# Patient Record
Sex: Female | Born: 1935 | Race: White | Hispanic: No | Smoking: Former smoker
Health system: Southern US, Community
[De-identification: ages and names within clinical notes are randomized; demographics above are authoritative.]

## PROBLEM LIST (undated history)

## (undated) DIAGNOSIS — I63511 Cerebral infarction due to unspecified occlusion or stenosis of right middle cerebral artery: Secondary | ICD-10-CM

## (undated) DIAGNOSIS — I10 Essential (primary) hypertension: Secondary | ICD-10-CM

## (undated) DIAGNOSIS — E039 Hypothyroidism, unspecified: Secondary | ICD-10-CM

## (undated) DIAGNOSIS — N183 Chronic kidney disease, stage 3 (moderate): Secondary | ICD-10-CM

## (undated) DIAGNOSIS — I2089 Other forms of angina pectoris: Secondary | ICD-10-CM

## (undated) DIAGNOSIS — R1310 Dysphagia, unspecified: Secondary | ICD-10-CM

## (undated) DIAGNOSIS — S42302A Unspecified fracture of shaft of humerus, left arm, initial encounter for closed fracture: Secondary | ICD-10-CM

## (undated) DIAGNOSIS — N390 Urinary tract infection, site not specified: Secondary | ICD-10-CM

## (undated) DIAGNOSIS — I208 Other forms of angina pectoris: Secondary | ICD-10-CM

## (undated) DIAGNOSIS — I2510 Atherosclerotic heart disease of native coronary artery without angina pectoris: Secondary | ICD-10-CM

## (undated) DIAGNOSIS — N179 Acute kidney failure, unspecified: Secondary | ICD-10-CM

## (undated) DIAGNOSIS — G92 Toxic encephalopathy: Secondary | ICD-10-CM

## (undated) DIAGNOSIS — I719 Aortic aneurysm of unspecified site, without rupture: Secondary | ICD-10-CM

## (undated) HISTORY — PX: ABDOMINAL HYSTERECTOMY: SHX81

## (undated) HISTORY — DX: Urinary tract infection, site not specified: N39.0

## (undated) HISTORY — PX: CAROTID ARTERY ANGIOPLASTY: SHX1300

## (undated) HISTORY — PX: CATARACT EXTRACTION: SUR2

## (undated) HISTORY — DX: Unspecified fracture of shaft of humerus, left arm, initial encounter for closed fracture: S42.302A

## (undated) HISTORY — DX: Chronic kidney disease, stage 3 (moderate): N18.3

## (undated) HISTORY — DX: Acute kidney failure, unspecified: N17.9

## (undated) HISTORY — DX: Hypothyroidism, unspecified: E03.9

## (undated) HISTORY — DX: Cerebral infarction due to unspecified occlusion or stenosis of right middle cerebral artery: I63.511

## (undated) HISTORY — DX: Dysphagia, unspecified: R13.10

## (undated) HISTORY — DX: Toxic encephalopathy: G92

## (undated) HISTORY — DX: Essential (primary) hypertension: I10

## (undated) HISTORY — PX: BLADDER SURGERY: SHX569

---

## 2002-08-14 ENCOUNTER — Encounter: Payer: Self-pay | Admitting: Cardiology

## 2002-08-14 ENCOUNTER — Encounter: Admission: RE | Admit: 2002-08-14 | Discharge: 2002-08-14 | Payer: Self-pay | Admitting: Cardiology

## 2002-08-28 ENCOUNTER — Encounter: Payer: Self-pay | Admitting: Cardiology

## 2002-08-28 ENCOUNTER — Encounter: Admission: RE | Admit: 2002-08-28 | Discharge: 2002-08-28 | Payer: Self-pay | Admitting: Cardiology

## 2012-02-14 ENCOUNTER — Other Ambulatory Visit: Payer: Self-pay | Admitting: Cardiology

## 2012-03-27 ENCOUNTER — Other Ambulatory Visit: Payer: Self-pay | Admitting: Cardiology

## 2012-05-11 ENCOUNTER — Other Ambulatory Visit: Payer: Self-pay | Admitting: Cardiology

## 2012-07-20 ENCOUNTER — Observation Stay (HOSPITAL_COMMUNITY)
Admission: EM | Admit: 2012-07-20 | Discharge: 2012-07-22 | Disposition: A | Discharge status: Home / Self Care | Payer: Medicare Other | Attending: Internal Medicine | Admitting: Internal Medicine

## 2012-07-20 ENCOUNTER — Emergency Department (HOSPITAL_COMMUNITY): Payer: Medicare Other

## 2012-07-20 ENCOUNTER — Encounter (HOSPITAL_COMMUNITY): Payer: Self-pay

## 2012-07-20 DIAGNOSIS — E039 Hypothyroidism, unspecified: Secondary | ICD-10-CM

## 2012-07-20 DIAGNOSIS — I2510 Atherosclerotic heart disease of native coronary artery without angina pectoris: Secondary | ICD-10-CM | POA: Insufficient documentation

## 2012-07-20 DIAGNOSIS — M25519 Pain in unspecified shoulder: Secondary | ICD-10-CM | POA: Insufficient documentation

## 2012-07-20 DIAGNOSIS — S0003XA Contusion of scalp, initial encounter: Secondary | ICD-10-CM | POA: Insufficient documentation

## 2012-07-20 DIAGNOSIS — S42213A Unspecified displaced fracture of surgical neck of unspecified humerus, initial encounter for closed fracture: Principal | ICD-10-CM | POA: Insufficient documentation

## 2012-07-20 DIAGNOSIS — S0083XA Contusion of other part of head, initial encounter: Secondary | ICD-10-CM | POA: Insufficient documentation

## 2012-07-20 DIAGNOSIS — M542 Cervicalgia: Secondary | ICD-10-CM | POA: Insufficient documentation

## 2012-07-20 DIAGNOSIS — S42209A Unspecified fracture of upper end of unspecified humerus, initial encounter for closed fracture: Secondary | ICD-10-CM

## 2012-07-20 DIAGNOSIS — I10 Essential (primary) hypertension: Secondary | ICD-10-CM

## 2012-07-20 DIAGNOSIS — S42302A Unspecified fracture of shaft of humerus, left arm, initial encounter for closed fracture: Secondary | ICD-10-CM | POA: Diagnosis present

## 2012-07-20 DIAGNOSIS — I719 Aortic aneurysm of unspecified site, without rupture: Secondary | ICD-10-CM | POA: Diagnosis present

## 2012-07-20 DIAGNOSIS — W010XXA Fall on same level from slipping, tripping and stumbling without subsequent striking against object, initial encounter: Secondary | ICD-10-CM | POA: Insufficient documentation

## 2012-07-20 HISTORY — DX: Other forms of angina pectoris: I20.8

## 2012-07-20 HISTORY — DX: Hypothyroidism, unspecified: E03.9

## 2012-07-20 HISTORY — DX: Atherosclerotic heart disease of native coronary artery without angina pectoris: I25.10

## 2012-07-20 HISTORY — DX: Essential (primary) hypertension: I10

## 2012-07-20 HISTORY — DX: Aortic aneurysm of unspecified site, without rupture: I71.9

## 2012-07-20 HISTORY — DX: Other forms of angina pectoris: I20.89

## 2012-07-20 MED ORDER — FENTANYL CITRATE 0.05 MG/ML IJ SOLN
INTRAMUSCULAR | Status: AC
  Administered 2012-07-20: 50 ug
  Filled 2012-07-20: qty 2

## 2012-07-20 NOTE — ED Notes (Signed)
Pt reports her balance not that good anymore, she admits to needing to use a cane but doesn't.  She was walking outside to pick up mail and lost her balance and fell.  She is c/o left shoulder pain.  No other areas are painful.

## 2012-07-20 NOTE — ED Notes (Signed)
Pt taken off backboard with four person assist.  C-collar in place.

## 2012-07-20 NOTE — ED Notes (Signed)
AVW:[id number]<BJ> Expected date:<BR> Expected time:<BR> Means of arrival:<BR> Comments:<BR> 78 yo female who fell-EMS

## 2012-07-20 NOTE — ED Notes (Signed)
MD at bedside. 

## 2012-07-20 NOTE — ED Provider Notes (Signed)
History     CSN: [id number]  Arrival date & time 07/20/12  2012   First MD Initiated Contact with Patient 07/20/12 2252      Chief Complaint  Patient presents with  . Fall    (Consider location/radiation/quality/duration/timing/severity/associated sxs/prior treatment) HPI Pt reports she stumbled and fell at her home just prior to arrival, hitting her head, complaining of neck pain and L shoulder pain, moderate to severe, aching and worse with movement. Denies LOC.  Past Medical History  Diagnosis Date  . Hypertension   . Aortic aneurysm   . Angina at rest   . Hypothyroid   . Coronary artery disease     Past Surgical History  Procedure Date  . Bladder surgery   . Carotid artery angioplasty   . Abdominal hysterectomy   . Cataract extraction     History reviewed. No pertinent family history.  History  Substance Use Topics  . Smoking status: Former Games developer  . Smokeless tobacco: Not on file  . Alcohol Use: No    OB History    Grav Para Term Preterm Abortions TAB SAB Ect Mult Living                  Review of Systems All other systems reviewed and are negative except as noted in HPI.   Allergies  Sulfa antibiotics  Home Medications   Current Outpatient Rx  Name Route Sig Dispense Refill  . ASPIRIN EC 81 MG PO TBEC Oral Take 81 mg by mouth daily.    . ATENOLOL 50 MG PO TABS Oral Take 50 mg by mouth daily.    Marland Kitchen OLMESARTAN MEDOXOMIL 40 MG PO TABS Oral Take 40 mg by mouth daily.    Marland Kitchen SIMVASTATIN 40 MG PO TABS Oral Take 40 mg by mouth every evening.      BP 204/78  Pulse 62  Temp 97.7 F (36.5 C) (Oral)  Resp 20  SpO2 93%  Physical Exam  Nursing note and vitals reviewed. Constitutional: She is oriented to person, place, and time. She appears well-developed and well-nourished.  HENT:  Head: Normocephalic.       L periorbital ecchymosis  Eyes: EOM are normal. Pupils are equal, round, and reactive to light.  Neck:       In C-collar  Cardiovascular:  Normal rate, normal heart sounds and intact distal pulses.   Pulmonary/Chest: Effort normal and breath sounds normal.  Abdominal: Bowel sounds are normal. She exhibits no distension. There is no tenderness.  Musculoskeletal:       Tender to L shoulder, unable to ROM, neurovascularly intact  Neurological: She is alert and oriented to person, place, and time. She has normal strength. No cranial nerve deficit or sensory deficit.  Skin: Skin is warm and dry. No rash noted.  Psychiatric: She has a normal mood and affect.    ED Course  Procedures (including critical care time)  Labs Reviewed - No data to display Dg Shoulder Left  07/20/2012  *RADIOLOGY REPORT*  Clinical Data: Left shoulder pain status post fall.  LEFT SHOULDER - 2+ VIEW  Comparison: None.  Findings: Positioning is limited by the patient's injury.  There is a comminuted and angulated fracture of the humeral neck.  There is no gross dislocation.  The subacromial space appears narrowed.  On the Y-view, there is possible irregularity of the posterior cortex of the scapula.  IMPRESSION:  1.  Comminuted fracture of the left humeral neck with angulation. 2.  Cannot exclude a  scapular fracture, not optimally evaluated by this examination.   Original Report Authenticated By: Gerrianne Scale, M.D.      No diagnosis found.    MDM  Pt with L humerus fx as above. Sling ordered. Fentanyl given. Head and c-spine CT ordered as well.    12:30 AM Imaging as above. Pt lives alone, concern for safety with fracture. Discussed with Dr. Conley Rolls who will admit for pain control, PT and further eval. Dr. Conley Rolls asked me to discuss with On-call Ortho as well. Awaiting call back from Dr. Sherlean Foot.     Charles B. Bernette Mayers, MD 07/21/12 1112

## 2012-07-20 NOTE — ED Notes (Signed)
Per EMS, pt from apartment.  Pt fell outside of home at [employer].  No LOC.  No c/o neck/back pain.  Pt on LSB with neck brace.  Left shoulder possible rotation.  No active bleeding.  Vitals 140/82, hr 68, resp 16, sats low on arrival.  Oxygen at 2l per [place] 96-97%.  Hx HTN, Pt lives alone.

## 2012-07-21 ENCOUNTER — Encounter (HOSPITAL_COMMUNITY): Payer: Self-pay | Admitting: Internal Medicine

## 2012-07-21 DIAGNOSIS — E039 Hypothyroidism, unspecified: Secondary | ICD-10-CM | POA: Diagnosis present

## 2012-07-21 DIAGNOSIS — I10 Essential (primary) hypertension: Secondary | ICD-10-CM

## 2012-07-21 DIAGNOSIS — I719 Aortic aneurysm of unspecified site, without rupture: Secondary | ICD-10-CM | POA: Diagnosis present

## 2012-07-21 DIAGNOSIS — S42302A Unspecified fracture of shaft of humerus, left arm, initial encounter for closed fracture: Secondary | ICD-10-CM | POA: Diagnosis present

## 2012-07-21 HISTORY — DX: Essential (primary) hypertension: I10

## 2012-07-21 HISTORY — DX: Unspecified fracture of shaft of humerus, left arm, initial encounter for closed fracture: S42.302A

## 2012-07-21 HISTORY — DX: Hypothyroidism, unspecified: E03.9

## 2012-07-21 LAB — CBC
MCH: 31.2 pg (ref 26.0–34.0)
MCHC: 32.3 g/dL (ref 30.0–36.0)
Platelets: 246 10*3/uL (ref 150–400)
RBC: 3.82 MIL/uL — ABNORMAL LOW (ref 3.87–5.11)

## 2012-07-21 LAB — CBC WITH DIFFERENTIAL/PLATELET
Basophils Relative: 1 % (ref 0–1)
Eosinophils Absolute: 0 10*3/uL (ref 0.0–0.7)
Eosinophils Relative: 0 % (ref 0–5)
Lymphs Abs: 1 10*3/uL (ref 0.7–4.0)
MCH: 31.3 pg (ref 26.0–34.0)
MCHC: 32.6 g/dL (ref 30.0–36.0)
MCV: 96 fL (ref 78.0–100.0)
Neutrophils Relative %: 84 % — ABNORMAL HIGH (ref 43–77)
Platelets: 264 10*3/uL (ref 150–400)
RBC: 3.74 MIL/uL — ABNORMAL LOW (ref 3.87–5.11)

## 2012-07-21 LAB — BASIC METABOLIC PANEL
BUN: 29 mg/dL — ABNORMAL HIGH (ref 6–23)
Calcium: 8.7 mg/dL (ref 8.4–10.5)
GFR calc Af Amer: 66 mL/min — ABNORMAL LOW (ref >90)
GFR calc non Af Amer: 57 mL/min — ABNORMAL LOW (ref >90)
Glucose, Bld: 142 mg/dL — ABNORMAL HIGH (ref 70–99)
Potassium: 3.6 mEq/L (ref 3.5–5.1)
Sodium: 138 mEq/L (ref 135–145)

## 2012-07-21 LAB — CREATININE, SERUM
Creatinine, Ser: 0.83 mg/dL (ref 0.50–1.10)
GFR calc non Af Amer: 59 mL/min — ABNORMAL LOW (ref >90)

## 2012-07-21 MED ORDER — SIMVASTATIN 40 MG PO TABS
40.0000 mg | ORAL_TABLET | Freq: Every day | ORAL | Status: DC
  Administered 2012-07-21 (×2): 40 mg via ORAL
  Filled 2012-07-21 (×3): qty 1

## 2012-07-21 MED ORDER — SIMVASTATIN 40 MG PO TABS
40.0000 mg | ORAL_TABLET | Freq: Every evening | ORAL | Status: DC
  Filled 2012-07-21: qty 1

## 2012-07-21 MED ORDER — ENOXAPARIN SODIUM 40 MG/0.4ML ~~LOC~~ SOLN
40.0000 mg | SUBCUTANEOUS | Status: DC
  Administered 2012-07-21: 40 mg via SUBCUTANEOUS
  Filled 2012-07-21 (×2): qty 0.4

## 2012-07-21 MED ORDER — ONDANSETRON HCL 4 MG/2ML IJ SOLN: 4.0000 mg | Freq: Four times a day (QID) | INTRAMUSCULAR | Status: DC | PRN

## 2012-07-21 MED ORDER — SODIUM CHLORIDE 0.9 % IJ SOLN: 3.0000 mL | INTRAMUSCULAR | Status: DC | PRN

## 2012-07-21 MED ORDER — ATENOLOL 50 MG PO TABS
50.0000 mg | ORAL_TABLET | Freq: Every day | ORAL | Status: DC
  Administered 2012-07-21 (×2): 50 mg via ORAL
  Filled 2012-07-21 (×3): qty 1

## 2012-07-21 MED ORDER — SODIUM CHLORIDE 0.9 % IV SOLN
250.0000 mL | INTRAVENOUS | Status: DC | PRN
  Administered 2012-07-21: 250 mL via INTRAVENOUS

## 2012-07-21 MED ORDER — BLISTEX EX OINT
TOPICAL_OINTMENT | CUTANEOUS | Status: AC
  Administered 2012-07-21: 05:00:00
  Filled 2012-07-21: qty 10

## 2012-07-21 MED ORDER — SODIUM CHLORIDE 0.9 % IJ SOLN
3.0000 mL | Freq: Two times a day (BID) | INTRAMUSCULAR | Status: DC
  Administered 2012-07-21 (×2): 3 mL via INTRAVENOUS

## 2012-07-21 MED ORDER — DOCUSATE SODIUM 100 MG PO CAPS
100.0000 mg | ORAL_CAPSULE | Freq: Two times a day (BID) | ORAL | Status: DC
  Administered 2012-07-21 – 2012-07-22 (×3): 100 mg via ORAL

## 2012-07-21 MED ORDER — ATENOLOL 50 MG PO TABS
50.0000 mg | ORAL_TABLET | Freq: Every day | ORAL | Status: DC
  Filled 2012-07-21: qty 1

## 2012-07-21 MED ORDER — HYDROMORPHONE HCL PF 1 MG/ML IJ SOLN
INTRAMUSCULAR | Status: AC
  Filled 2012-07-21: qty 1

## 2012-07-21 MED ORDER — HYDROMORPHONE HCL PF 1 MG/ML IJ SOLN
0.5000 mg | INTRAMUSCULAR | Status: DC | PRN
  Administered 2012-07-21: 0.5 mg via INTRAVENOUS

## 2012-07-21 MED ORDER — ASPIRIN EC 81 MG PO TBEC
81.0000 mg | DELAYED_RELEASE_TABLET | Freq: Every day | ORAL | Status: DC
  Administered 2012-07-21 – 2012-07-22 (×2): 81 mg via ORAL
  Filled 2012-07-21 (×2): qty 1

## 2012-07-21 MED ORDER — MORPHINE SULFATE 2 MG/ML IJ SOLN
2.0000 mg | INTRAMUSCULAR | Status: DC | PRN
  Administered 2012-07-21: 2 mg via INTRAVENOUS
  Filled 2012-07-21: qty 1

## 2012-07-21 MED ORDER — ONDANSETRON HCL 4 MG PO TABS: 4.0000 mg | ORAL_TABLET | Freq: Four times a day (QID) | ORAL | Status: DC | PRN

## 2012-07-21 MED ORDER — IRBESARTAN 300 MG PO TABS
300.0000 mg | ORAL_TABLET | Freq: Every day | ORAL | Status: DC
  Administered 2012-07-21 – 2012-07-22 (×2): 300 mg via ORAL
  Filled 2012-07-21 (×2): qty 1

## 2012-07-21 MED ORDER — ACETAMINOPHEN 325 MG PO TABS
650.0000 mg | ORAL_TABLET | Freq: Four times a day (QID) | ORAL | Status: DC | PRN
  Administered 2012-07-21 – 2012-07-22 (×3): 650 mg via ORAL
  Filled 2012-07-21 (×3): qty 2

## 2012-07-21 NOTE — Evaluation (Signed)
Physical Therapy Evaluation Patient Details Name: Jennifer Zavala MRN: [id number] DOB: 1916/05/10 Today's Date: 07/21/2012 Time: 1130-1150 PT Time Calculation (min): 20 min  PT Assessment / Plan / Recommendation Clinical Impression  78 yo female admitted after fall at home; sustained L humeral fracture. Pt lives alone. On eval, requires Min A for mobility. Discussed d/c options. Pt open to ST rehab prior to returning home alone. Recommend SNF for rehab to maximize independence with functional mobilty .    PT Assessment  Patient needs continued PT services    Follow Up Recommendations  Skilled nursing facility    Barriers to Discharge Decreased caregiver support      Equipment Recommendations  Defer to next venue    Recommendations for Other Services OT consult   Frequency Min 3X/week    Precautions / Restrictions Precautions Precautions: Fall;Shoulder Type of Shoulder Precautions: NWB. L shoulder sling Restrictions Weight Bearing Restrictions: Yes LUE Weight Bearing: Non weight bearing   Pertinent Vitals/Pain       Mobility  Bed Mobility Bed Mobility: Supine to Sit Supine to Sit: HOB elevated;4: Min assist Details for Bed Mobility Assistance: Increased time. VCS safety, technique, hand placement, adhrence to NWB L UE. Utilized bed pad to assist with scooting, positioning Transfers Transfers: Sit to Stand;Stand to Sit Sit to Stand: 4: Min assist;With upper extremity assist;From bed;From elevated surface Stand to Sit: 4: Min assist;With upper extremity assist;With armrests;To chair/3-in-1 Details for Transfer Assistance: VCs safety, technique, hand placement. assist to rise, stabilize, control descent. Reports increased pain in L shoulder with attempt to rise. Ambulation/Gait Ambulation/Gait Assistance: 4: Min assist Ambulation Distance (Feet): 120 Feet Assistive device: Straight cane Ambulation/Gait Assistance Details: Assist to stabilize throughout ambulation.  Limited activity tolerance. Unsteady intermittently Gait Pattern: Step-through pattern;Decreased stride length;Decreased step length - right;Decreased step length - left    Exercises     PT Diagnosis: Difficulty walking;Acute pain  PT Problem List: Decreased activity tolerance;Decreased balance;Decreased mobility;Pain;Decreased knowledge of use of DME PT Treatment Interventions: DME instruction;Gait training;Functional mobility training;Therapeutic activities;Therapeutic exercise;Balance training;Patient/family education   PT Goals Acute Rehab PT Goals PT Goal Formulation: With patient Time For Goal Achievement: 08/04/12 Potential to Achieve Goals: Good Pt will go Supine/Side to Sit: with modified independence PT Goal: Supine/Side to Sit - Progress: Goal set today Pt will go Sit to Supine/Side: with modified independence PT Goal: Sit to Supine/Side - Progress: Goal set today Pt will go Sit to Stand: with modified independence PT Goal: Sit to Stand - Progress: Goal set today Pt will Ambulate: >150 feet;with modified independence PT Goal: Ambulate - Progress: Goal set today  Visit Information  Last PT Received On: 07/21/12 Assistance Needed: +1    Subjective Data  Subjective: "I can't do this" Patient Stated Goal: Home. Less pain   Prior Functioning  Home Living Lives With: Alone Type of Home: Apartment Home Access: Level entry Home Layout: One level Bathroom Toilet: Standard Home Adaptive Equipment: Walker - rolling;Straight cane Prior Function Level of Independence: Independent Communication Communication: No difficulties    Cognition  Overall Cognitive Status: Appears within functional limits for tasks assessed/performed Arousal/Alertness: Awake/alert Orientation Level: Appears intact for tasks assessed Behavior During Session: [facility] for tasks performed    Extremity/Trunk Assessment Right Lower Extremity Assessment RLE ROM/Strength/Tone: [facility] for tasks assessed Left  Lower Extremity Assessment LLE ROM/Strength/Tone: [facility] for tasks assessed   Balance    End of Session PT - End of Session Equipment Utilized During Treatment: Gait belt Activity Tolerance: Patient limited by  pain Patient left: in chair;with call bell/phone within reach  GP Functional Assessment Tool Used: Clinical judgment Functional Limitation: Mobility: Walking and moving around Mobility: Walking and Moving Around Current Status [phone]): At least 20 percent but less than 40 percent impaired, limited or restricted Mobility: Walking and Moving Around Goal Status [phone]): 0 percent impaired, limited or restricted   Rebeca Alert [facility] 07/21/2012, 1:20 PM [phone]

## 2012-07-21 NOTE — Care Management Note (Unsigned)
    Page 1 of 1   07/21/2012     6:32:31 PM   CARE MANAGEMENT NOTE 07/21/2012  Patient:  Jennifer Zavala, Jennifer Zavala   Account Number:  [id number]  Date Initiated:  07/21/2012  Documentation initiated by:  Colleen Can  Subjective/Objective Assessment:   dx comminuted and angulated frature of  humerus     Action/Plan:   Pt wants SNF/lives alone   Anticipated DC Date:  07/23/2012   Anticipated DC Plan:  HOME W HOME HEALTH SERVICES  In-house referral  Clinical Social Worker      DC Planning Services  CM consult      Choice offered to / List presented to:             Status of service:  In process, will continue to follow Medicare Important Message given?   (If response is "NO", the following Medicare IM given date fields will be blank) Date Medicare IM given:   Date Additional Medicare IM given:    Discharge Disposition:    Per UR Regulation:  Reviewed for med. necessity/level of care/duration of stay  If discussed at Long Length of Stay Meetings, dates discussed:    Comments:

## 2012-07-21 NOTE — Progress Notes (Signed)
Clinical Social Work Department BRIEF PSYCHOSOCIAL ASSESSMENT 07/21/2012  Patient:  Jennifer Zavala, Jennifer Zavala     Account Number:  [id number]     Admit date:  07/20/2012  Clinical Social Worker: Lia Foyer, Theresia Majors ,  Date/Time:  07/21/2012 05:31 PM  Referred by:  RN  Date Referred:  07/21/2012 Referred for  SNF Placement   Other Referral:   Interview type:  Patient Other interview type:    PSYCHOSOCIAL DATA Living Status:  ALONE Admitted from facility:   Level of care:   Primary support name:  Ellin Mayhew Primary support relationship to patient:  FAMILY Degree of support available:   Strong and vested    CURRENT CONCERNS Current Concerns  Post-Acute Placement   Other Concerns:    SOCIAL WORK ASSESSMENT / PLAN CSW consulted by RN re: SNF placement. SNF placement was recommended by PT, and patient is currently on observation. RNCM, Bonita Quin requested patient be moved to inpatient status, and was denied. CSW met with patient to discuss d/c plan. The patient currently lives alone at home.  CSW provided education on the SNF process and supportive counseling on not being Medicare eligible for a SNF placement. CSW attempted to plan for d/c with [facility] with the patient but she stated she would not receive much help from her daughter in law Vickie [phone]) due to her having two children. CSW encouraged the patient to speak with Vickie regarding help while she recovers and she stated that she refuses to ask for help from her children. CSW informed the patient of the option to privately pay, and she stated that was not an option. CSW contacted Janie the [facility] Liaison (patient's SNF choice) to inquire about the patient coming. The cost of private pay was too high for the patient. The d/c plan at this time is to d/c home, with [facility] if she stays on observation status. CSW signing off. Please re-consult with SW if needs change.   Assessment/plan status:  Information/Referral to [employer] Other assessment/ plan:   Information/referral to community resources:    PATIENT'S/FAMILY'S RESPONSE TO PLAN OF CARE: Patient thankful for CSW's help, and aware of d/c plan at this time.    Lia Foyer, LCSWA [facility] Clinical Social Worker Contact #: [phone] (PRN)

## 2012-07-21 NOTE — Progress Notes (Signed)
   Seen earlier today by my colleague Dr. Conley Rolls.  Patient seen and examined, data base reviewed.  Came in with a left humeral fracture after a fall.  Mechanical fall, no evidence of loss of consciousness, she also has hypertension.  She probably going to need placement in a SNF.  Clint Lipps Pager: [phone] 07/21/2012, 2:12 PM

## 2012-07-21 NOTE — H&P (Signed)
[facility] History and Physical  BETH GOODLIN WJX:[id number] DOB: [birth year]    PCP:   Johny Blamer, MD   Chief Complaint: Jennifer Zavala with left humeral fracture.  HPI: Jennifer Zavala is an 78 y.o. female with hx of stable CAD, HTN, Aortic aneurysm which she doesn't want repair, lives alone with good functional baseline, tripped and fell suffering a hematoma over her left orbit, and hurt her left shoulder.  ER evaluation included a negative head and cervical CT, and Xray of her left shoulder showed angulated and comminuted humeral fracture, unclear about scapula as it was not well visualized..  Orthopedic Dr Wylene Men was consulted by ER MD, doesn't feel she will need surgery tomorrow, and if she does need ORIF, it will be done electively.  Hospitalist was asked to admit her to set up help since she lives alone.  Rewiew of Systems:  Constitutional: Negative for malaise, fever and chills. No significant weight loss or weight gain Eyes: Negative for eye pain, redness and discharge, diplopia, visual changes, or flashes of light. ENMT: Negative for ear pain, hoarseness, nasal congestion, sinus pressure and sore throat. No headaches; tinnitus, drooling, or problem swallowing. Cardiovascular: Negative for chest pain, palpitations, diaphoresis, dyspnea and peripheral edema. ; No orthopnea, PND Respiratory: Negative for cough, hemoptysis, wheezing and stridor. No pleuritic chestpain. Gastrointestinal: Negative for nausea, vomiting, diarrhea, constipation, abdominal pain, melena, blood in stool, hematemesis, jaundice and rectal bleeding.    Genitourinary: Negative for frequency, dysuria, incontinence,flank pain and hematuria; Musculoskeletal: Negative for back pain and neck pain.  Skin: . Negative for pruritus, rash, abrasions, bruising and skin lesion.; ulcerations Neuro: Negative for headache, lightheadedness and neck stiffness. Negative for weakness, altered level of consciousness ,  altered mental status, extremity weakness, burning feet, involuntary movement, seizure and syncope.  Psych: negative for anxiety, depression, insomnia, tearfulness, panic attacks, hallucinations, paranoia, suicidal or homicidal ideation   Past Medical History  Diagnosis Date  . Hypertension   . Aortic aneurysm   . Angina at rest   . Hypothyroid   . Coronary artery disease     Past Surgical History  Procedure Date  . Bladder surgery   . Carotid artery angioplasty   . Abdominal hysterectomy   . Cataract extraction     Medications:  HOME MEDS: Prior to Admission medications   Medication Sig Start Date End Date Taking? Authorizing Provider  aspirin EC 81 MG tablet Take 81 mg by mouth daily.   Yes Historical Provider, MD  atenolol (TENORMIN) 50 MG tablet Take 50 mg by mouth daily.   Yes Historical Provider, MD  olmesartan (BENICAR) 40 MG tablet Take 40 mg by mouth daily.   Yes Historical Provider, MD  simvastatin (ZOCOR) 40 MG tablet Take 40 mg by mouth every evening.   Yes Historical Provider, MD     Allergies:  Allergies  Allergen Reactions  . Sulfa Antibiotics     unknown    Social History:   reports that she has quit smoking. She does not have any smokeless tobacco history on file. She reports that she does not drink alcohol or use illicit drugs.  Family History: History reviewed. No pertinent family history.   Physical Exam: Filed Vitals:   07/20/12 2032 07/21/12 0049 07/21/12 0051  BP: 204/78 155/58   Pulse: 62 62   Temp: 97.7 F (36.5 C) 98 F (36.7 C)   TempSrc: Oral Oral   Resp: 20 16   SpO2: 93% 89% 94%   Blood pressure 155/58, pulse  62, temperature 98 F (36.7 C), temperature source Oral, resp. rate 16, SpO2 94.00%.  GEN:  Pleasant patient lying in the stretcher in no acute distress; cooperative with exam. PSYCH:  alert and oriented x4; does not appear anxious or depressed; affect is appropriate. HEENT: Mucous membranes pink and anicteric; PERRLA;  EOM intact; no cervical lymphadenopathy nor thyromegaly or carotid bruit; no JVD; There were no stridor. Neck is very supple. Breasts:: Not examined CHEST WALL: No tenderness CHEST: Normal respiration, clear to auscultation bilaterally.  HEART: Regular rate and rhythm.  There is a solf flow murmur at LSB.  BACK: No kyphosis or scoliosis; no CVA tenderness ABDOMEN: soft and non-tender; no masses, no organomegaly, normal abdominal bowel sounds; no pannus; no intertriginous candida. There is no rebound and no distention. Rectal Exam: Not done EXTREMITIES: No bone or joint deformity; age-appropriate arthropathy of the hands and knees; no edema; no ulcerations.  There is no calf tenderness.  Her left shoulder is tender with any minimal movement. Genitalia: not examined PULSES: 2+ and symmetric SKIN: Normal hydration no rash or ulceration CNS: Cranial nerves 2-12 grossly intact no focal lateralizing neurologic deficit.  Speech is fluent; uvula elevated with phonation, facial symmetry and tongue midline. DTR are normal bilaterally, cerebella exam is intact, barbinski is negative and strengths are equaled bilaterally.  No sensory loss.   Labs on Admission:  Basic Metabolic Panel and CBC were pending at time of admission.   Radiological Exams on Admission: Ct Head Wo Contrast  07/21/2012  *RADIOLOGY REPORT*  Clinical Data:  Dizziness with fall.  Left shoulder pain.  CT HEAD WITHOUT CONTRAST CT CERVICAL SPINE WITHOUT CONTRAST  Technique:  Multidetector CT imaging of the head and cervical spine was performed following the standard protocol without intravenous contrast.  Multiplanar CT image reconstructions of the cervical spine were also generated.  Comparison:   None  CT HEAD  Findings: There is no evidence of acute intracranial hemorrhage, mass effect, brain edema or extra-axial fluid collection.  The ventricles and subarachnoid spaces are diffusely prominent consistent with mild atrophy. Diffuse  periventricular low density is most compatible with moderate chronic small vessel ischemic change.   No acute cortical infarction is seen.  There is right maxillary sinus mucosal thickening which appears chronic based on the small size of the right maxillary sinus.  No air-fluid levels are identified.  Intracranial vascular calcifications are noted. The calvarium is intact.  IMPRESSION:  1.  No acute intracranial or calvarial findings. 2.  Atrophy and chronic small vessel ischemic changes. 3.  Chronic-appearing right maxillary sinus mucosal thickening.  CT CERVICAL SPINE  Findings: There is no evidence of acute cervical spine fracture or traumatic subluxation.  There is mild spondylosis with disc space loss most advanced at C5-C6.  There is physiologic rotation at C1- C2.  Scattered facet degenerative changes are present.  No acute soft tissue findings are evident.  The patient appears to be status post left thyroid resection.  IMPRESSION: No evidence of a acute cervical spine fracture, traumatic subluxation or static signs of instability.  Spondylosis as described.   Original Report Authenticated By: Gerrianne Scale, M.D.    Ct Cervical Spine Wo Contrast  07/21/2012  *RADIOLOGY REPORT*  Clinical Data:  Dizziness with fall.  Left shoulder pain.  CT HEAD WITHOUT CONTRAST CT CERVICAL SPINE WITHOUT CONTRAST  Technique:  Multidetector CT imaging of the head and cervical spine was performed following the standard protocol without intravenous contrast.  Multiplanar CT image reconstructions of  the cervical spine were also generated.  Comparison:   None  CT HEAD  Findings: There is no evidence of acute intracranial hemorrhage, mass effect, brain edema or extra-axial fluid collection.  The ventricles and subarachnoid spaces are diffusely prominent consistent with mild atrophy. Diffuse periventricular low density is most compatible with moderate chronic small vessel ischemic change.   No acute cortical infarction is seen.   There is right maxillary sinus mucosal thickening which appears chronic based on the small size of the right maxillary sinus.  No air-fluid levels are identified.  Intracranial vascular calcifications are noted. The calvarium is intact.  IMPRESSION:  1.  No acute intracranial or calvarial findings. 2.  Atrophy and chronic small vessel ischemic changes. 3.  Chronic-appearing right maxillary sinus mucosal thickening.  CT CERVICAL SPINE  Findings: There is no evidence of acute cervical spine fracture or traumatic subluxation.  There is mild spondylosis with disc space loss most advanced at C5-C6.  There is physiologic rotation at C1- C2.  Scattered facet degenerative changes are present.  No acute soft tissue findings are evident.  The patient appears to be status post left thyroid resection.  IMPRESSION: No evidence of a acute cervical spine fracture, traumatic subluxation or static signs of instability.  Spondylosis as described.   Original Report Authenticated By: Gerrianne Scale, M.D.    Dg Shoulder Left  07/20/2012  *RADIOLOGY REPORT*  Clinical Data: Left shoulder pain status post fall.  LEFT SHOULDER - 2+ VIEW  Comparison: None.  Findings: Positioning is limited by the patient's injury.  There is a comminuted and angulated fracture of the humeral neck.  There is no gross dislocation.  The subacromial space appears narrowed.  On the Y-view, there is possible irregularity of the posterior cortex of the scapula.  IMPRESSION:  1.  Comminuted fracture of the left humeral neck with angulation. 2.  Cannot exclude a scapular fracture, not optimally evaluated by this examination.   Original Report Authenticated By: Gerrianne Scale, M.D.     Assessment/Plan Present on Admission:  .Fracture of left humerus .HTN (hypertension) .Hypothyroidism .Aortic aneurysm   PLAN:  Will admit her and consult social service to see if help can be set up at home.  She will be given pain medication and left shoulder  immobilization.  As per ortho, no surgery is planned at this time.  She will have her meds continued.  I will check her TSH for her hx of hypothyroidism.  She is currently not taking any Synthroid.  She is stable and will be admitted to [facility].  She conferred to me that she would like to be DNR/DNI.    Other plans as per orders.  Code Status: DNR/DNI.   Houston Siren, MD. [facility] Pager [phone] 7pm to 7am.  07/21/2012, 1:13 AM

## 2012-07-22 LAB — T3, FREE: T3, Free: 2.7 pg/mL (ref 2.3–4.2)

## 2012-07-22 LAB — BASIC METABOLIC PANEL
BUN: 24 mg/dL — ABNORMAL HIGH (ref 6–23)
CO2: 23 mEq/L (ref 19–32)
Calcium: 9 mg/dL (ref 8.4–10.5)
Creatinine, Ser: 0.85 mg/dL (ref 0.50–1.10)
Glucose, Bld: 103 mg/dL — ABNORMAL HIGH (ref 70–99)

## 2012-07-22 LAB — CBC
MCH: 31.7 pg (ref 26.0–34.0)
MCHC: 32.8 g/dL (ref 30.0–36.0)
MCV: 96.7 fL (ref 78.0–100.0)
Platelets: 191 10*3/uL (ref 150–400)
RDW: 14.3 % (ref 11.5–15.5)

## 2012-07-22 MED ORDER — OXYCODONE-ACETAMINOPHEN 5-325 MG PO TABS: 1.0000 | ORAL_TABLET | ORAL | Status: DC | PRN

## 2012-07-22 NOTE — Progress Notes (Signed)
Per RN and CM information, Pt refuses SNF.   No further CSW needs at this time.  Should any additional needs arise please reconsult CSW.   CSW signing off this admission.   Leron Croak, LCSWA [employer] [phone]

## 2012-07-22 NOTE — Discharge Summary (Signed)
Physician Discharge Summary  Jennifer Zavala ZOX:[id number] DOB: [birth year] DOA: 07/20/2012  PCP: Johny Blamer, MD  Admit date: 07/20/2012 Discharge date: 07/22/2012  Recommendations for Outpatient Follow-up:  1. Followup with primary care physician as well as orthopedic as outpatient  Discharge Diagnoses:  Principal Problem:  *HTN (hypertension) Active Problems:  Fracture of left humerus  Hypothyroidism  Aortic aneurysm   Discharge Condition: Stable  Diet recommendation: Regular  Filed Weights   07/21/12 0300  Weight: 68.6 kg (151 lb 3.8 oz)    History of present illness:  Jennifer Zavala is an 78 y.o. female with hx of stable CAD, HTN, Aortic aneurysm which she doesn't want repair, lives alone with good functional baseline, tripped and fell suffering a hematoma over her left orbit, and hurt her left shoulder. ER evaluation included a negative head and cervical CT, and Xray of her left shoulder showed angulated and comminuted humeral fracture, unclear about scapula as it was not well visualized.. Orthopedic Dr Wylene Men was consulted by ER MD, doesn't feel she will need surgery tomorrow, and if she does need ORIF, it will be done electively. Hospitalist was asked to admit her to set up help since she lives alone.  Hospital Course:   1. Left humeral fracture: Patient came in to the hospital after she fell and broke her left humerus. Upon initial evaluation in the emergency department he physician consulted Dr. Sherlean Foot by the phone and he recommended no surgical intervention and if needed it will be elective. So [facility] hospitalist was asked to admit the patient for pain management. Patient admitted to the hospital as observation. Pain was controlled throughout the hospital stay, patient was seen by PT/OT and they both recommended skilled nursing facility. But patient primary insurance was Medicare, and because of observation status Medicare will not pay for her to go to SNF. Patient is  not eligible for inpatient stay per EHR as I spoke with, I think personally she has very high risk of rehospitalization with further falls and injuries. Patient elected to go home with home health services including PT/OT/RN and aide.  2. Hypothyroidism: Patient is not taking medication for that. This is likely subclinical hypothyroidism, her TSH is 7.0-9 and her free T4 is 1.14. Free T3 is normal to was 2.7.  3. Hypertension: She is on all the scar 10, this is being continued throughout the hospital stay. Patient also on low dose of aspirin is discontinued.  Procedures:  None  Consultations:  Phone consultation with Georgena Spurling of sports medicine and joints replacement of [place]  Discharge Exam: Filed Vitals:   07/21/12 1330 07/21/12 2145 07/22/12 0500 07/22/12 1423  BP: 97/54 160/50 168/80 94/52  Pulse: 54 55 55 62  Temp: 98.2 F (36.8 C) 99.5 F (37.5 C) 97.8 F (36.6 C) 97.6 F (36.4 C)  TempSrc:  Oral Oral Oral  Resp: 16 16 16 16   Height:      Weight:      SpO2: 94% 95% 94% 95%   General: Alert and awake, oriented x3, not in any acute distress. HEENT: anicteric sclera, pupils reactive to light and accommodation, EOMI CVS: S1-S2 clear, no murmur rubs or gallops Chest: clear to auscultation bilaterally, no wheezing, rales or rhonchi Abdomen: soft nontender, nondistended, normal bowel sounds, no organomegaly Extremities: no cyanosis, clubbing or edema noted bilaterally Neuro: Cranial nerves II-XII intact, no focal neurological deficits  Discharge Instructions  Discharge Orders    Future Orders Please Complete By Expires   Increase activity slowly  Medication List  As of 07/22/2012  3:27 PM   TAKE these medications         aspirin EC 81 MG tablet   Take 81 mg by mouth daily.      atenolol 50 MG tablet   Commonly known as: TENORMIN   Take 50 mg by mouth daily.      olmesartan 40 MG tablet   Commonly known as: BENICAR   Take 40 mg by mouth daily.        oxyCODONE-acetaminophen 5-325 MG per tablet   Commonly known as: PERCOCET/ROXICET   Take 1 tablet by mouth every 4 (four) hours as needed for pain.      simvastatin 40 MG tablet   Commonly known as: ZOCOR   Take 40 mg by mouth every evening.           Follow-up Information    Follow up with LUCEY,STEPHEN D, MD in 1 week. (Orthopedic surgeon)    Contact information:   [address] [place] [address] [phone]       Follow up with Johny Blamer, MD in 1 week.   Contact information:   [facility] [address] [place] [address] [phone]           The results of significant diagnostics from this hospitalization (including imaging, microbiology, ancillary and laboratory) are listed below for reference.    Significant Diagnostic Studies: Ct Head Wo Contrast  07/21/2012  *RADIOLOGY REPORT*  Clinical Data:  Dizziness with fall.  Left shoulder pain.  CT HEAD WITHOUT CONTRAST CT CERVICAL SPINE WITHOUT CONTRAST  Technique:  Multidetector CT imaging of the head and cervical spine was performed following the standard protocol without intravenous contrast.  Multiplanar CT image reconstructions of the cervical spine were also generated.  Comparison:   None  CT HEAD  Findings: There is no evidence of acute intracranial hemorrhage, mass effect, brain edema or extra-axial fluid collection.  The ventricles and subarachnoid spaces are diffusely prominent consistent with mild atrophy. Diffuse periventricular low density is most compatible with moderate chronic small vessel ischemic change.   No acute cortical infarction is seen.  There is right maxillary sinus mucosal thickening which appears chronic based on the small size of the right maxillary sinus.  No air-fluid levels are identified.  Intracranial vascular calcifications are noted. The calvarium is intact.  IMPRESSION:  1.  No acute intracranial or calvarial  findings. 2.  Atrophy and chronic small vessel ischemic changes. 3.  Chronic-appearing right maxillary sinus mucosal thickening.  CT CERVICAL SPINE  Findings: There is no evidence of acute cervical spine fracture or traumatic subluxation.  There is mild spondylosis with disc space loss most advanced at C5-C6.  There is physiologic rotation at C1- C2.  Scattered facet degenerative changes are present.  No acute soft tissue findings are evident.  The patient appears to be status post left thyroid resection.  IMPRESSION: No evidence of a acute cervical spine fracture, traumatic subluxation or static signs of instability.  Spondylosis as described.   Original Report Authenticated By: Gerrianne Scale, M.D.    Ct Cervical Spine Wo Contrast  07/21/2012  *RADIOLOGY REPORT*  Clinical Data:  Dizziness with fall.  Left shoulder pain.  CT HEAD WITHOUT CONTRAST CT CERVICAL SPINE WITHOUT CONTRAST  Technique:  Multidetector CT imaging of the head and cervical spine was performed following the standard protocol without intravenous contrast.  Multiplanar CT image reconstructions of the cervical spine  were also generated.  Comparison:   None  CT HEAD  Findings: There is no evidence of acute intracranial hemorrhage, mass effect, brain edema or extra-axial fluid collection.  The ventricles and subarachnoid spaces are diffusely prominent consistent with mild atrophy. Diffuse periventricular low density is most compatible with moderate chronic small vessel ischemic change.   No acute cortical infarction is seen.  There is right maxillary sinus mucosal thickening which appears chronic based on the small size of the right maxillary sinus.  No air-fluid levels are identified.  Intracranial vascular calcifications are noted. The calvarium is intact.  IMPRESSION:  1.  No acute intracranial or calvarial findings. 2.  Atrophy and chronic small vessel ischemic changes. 3.  Chronic-appearing right maxillary sinus mucosal thickening.  CT  CERVICAL SPINE  Findings: There is no evidence of acute cervical spine fracture or traumatic subluxation.  There is mild spondylosis with disc space loss most advanced at C5-C6.  There is physiologic rotation at C1- C2.  Scattered facet degenerative changes are present.  No acute soft tissue findings are evident.  The patient appears to be status post left thyroid resection.  IMPRESSION: No evidence of a acute cervical spine fracture, traumatic subluxation or static signs of instability.  Spondylosis as described.   Original Report Authenticated By: Gerrianne Scale, M.D.    Dg Shoulder Left  07/20/2012  *RADIOLOGY REPORT*  Clinical Data: Left shoulder pain status post fall.  LEFT SHOULDER - 2+ VIEW  Comparison: None.  Findings: Positioning is limited by the patient's injury.  There is a comminuted and angulated fracture of the humeral neck.  There is no gross dislocation.  The subacromial space appears narrowed.  On the Y-view, there is possible irregularity of the posterior cortex of the scapula.  IMPRESSION:  1.  Comminuted fracture of the left humeral neck with angulation. 2.  Cannot exclude a scapular fracture, not optimally evaluated by this examination.   Original Report Authenticated By: Gerrianne Scale, M.D.     Microbiology: No results found for this or any previous visit (from the past 240 hour(s)).   Labs: Basic Metabolic Panel:  Lab 07/22/12 1610 07/21/12 0356 07/21/12 0005  NA 141 -- 138  K 3.6 -- 3.6  CL 105 -- 102  CO2 23 -- 22  GLUCOSE 103* -- 142*  BUN 24* -- 29*  CREATININE 0.85 0.83 0.85  CALCIUM 9.0 -- 8.7  MG -- -- --  PHOS -- -- --   Liver Function Tests: No results found for this basename: AST:5,ALT:5,ALKPHOS:5,BILITOT:5,PROT:5,ALBUMIN:5 in the last 168 hours No results found for this basename: LIPASE:5,AMYLASE:5 in the last 168 hours No results found for this basename: AMMONIA:5 in the last 168 hours CBC:  Lab 07/22/12 0440 07/21/12 0356 07/21/12 0005  WBC  8.0 9.1 8.7  NEUTROABS -- -- 7.3  HGB 12.6 11.9* 11.7*  HCT 38.4 36.8 35.9*  MCV 96.7 96.3 96.0  PLT 191 246 264   Cardiac Enzymes: No results found for this basename: CKTOTAL:5,CKMB:5,CKMBINDEX:5,TROPONINI:5 in the last 168 hours BNP: BNP (last 3 results) No results found for this basename: PROBNP:3 in the last 8760 hours CBG: No results found for this basename: GLUCAP:5 in the last 168 hours  Time coordinating discharge: 40 minutes  Signed:  Sanari Offner A  [facility] 07/22/2012, 3:27 PM

## 2012-07-22 NOTE — Progress Notes (Signed)
Cm spoke with patient with daughter-n-aw at bedside concerning dc planning. Pt refuses SNF. Pt refuses out-of-pocket expense due to observation status. Pt to dc home with [facility] services. Per pt choice [facility] to provide [facility] services. Genevieve Norlander on-site rep Lorrie notified of referral. Rep given facesheet, H/P, Md orders. Start of care scheduled 07/24/12. Pt has acess to DME. Daughter-n-law to stay with patient for first 24 hour period post discharge. Per daughter-n-law assist pt with tx & home care. No other needs requested.   Leonie Green [phone]

## 2012-07-22 NOTE — Progress Notes (Addendum)
Occupational Therapy Treatment Patient Details Name: Jennifer Zavala MRN: [id number] DOB: [birth year] Today's Date: 07/22/2012 Time: 1510-1540 OT Time Calculation (min): 30 min  OT Assessment / Plan / Recommendation Comments on Treatment Session Pt did better with managing her L immobilizer but still needing assist overall with ADL. Pt's daughter present for end of session and states she will be with pt "some" but not 24/7 at home. Pt ready to take a nap once OT assisted back to bed. Verbally reviewed how to don/doff sling and shirt with daughter as well as with nursing tech  so she can assist with donning shirt/sling later if pt going home. Pt's family aware of PT/OT recommendation for SNF.     Follow Up Recommendations  Skilled nursing facility;Other (comment) (If pt refuses, will need HHOT and aide. )    Barriers to Discharge       Equipment Recommendations  None recommended by PT;None recommended by OT    Recommendations for Other Services    Frequency Min 2X/week   Plan Discharge plan remains appropriate    Precautions / Restrictions Precautions Precautions: Fall;Shoulder Type of Shoulder Precautions: NWB L. shoulder sling Precaution Booklet Issued: Yes (comment) Precaution Comments: issued shoulder care handout and reviewed with daughter in law. Explained that pt didnt have surgery on L shoulder but same guidelines follow as on handout for her L injured UE. Required Braces or Orthoses: Other Brace/Splint Other Brace/Splint: sling L UE Restrictions Weight Bearing Restrictions: Yes LUE Weight Bearing: Non weight bearing        ADL  Upper Body Dressing: Simulated;Moderate assistance Where Assessed - Upper Body Dressing: Unsupported sitting ADL Comments: Worked on pt donning/doffing sling herself. She did better this pm and required moderate assist. She was better able to reach for straps and velcro pieces to fasten and unfasten. Pt's daughter came in at end of session and  states she will not be with pt at all times. Emphasized PT/OT recommendation of SNF at discharge. Issued shoudler care handout to pt/daughter and reviewed with daughter. Pt already falling asleep in bed. Pt states she prefers to sleep in a recliner which is a good idea for her as she needs more assist to get up from bed. Pt is still unsafe to discharge home without 24 assist.    OT Diagnosis:    OT Problem List:   OT Treatment Interventions:     OT Goals ADL Goals ADL Goal: Upper Body Dressing - Progress: Progressing toward goals  Visit Information  Last OT Received On: 07/22/12 Assistance Needed: +1    Subjective Data  Subjective: I just want to lie down for awhile Patient Stated Goal: to lie down   Prior Functioning       Cognition  Overall Cognitive Status: Impaired Area of Impairment: Safety/judgement;Problem solving Arousal/Alertness: Awake/alert Orientation Level: Appears intact for tasks assessed Behavior During Session: [facility] for tasks performed Safety/Judgement: Decreased awareness of safety precautions;Decreased safety judgement for tasks assessed;Decreased awareness of need for assistance Cognition - Other Comments: pt up in room on her own when OT entered room. Decreased awareness of need for assist.     Mobility  Shoulder Instructions Bed Mobility Bed Mobility: Sit to Supine Sit to Supine: 4: Min assist Details for Bed Mobility Assistance: increased time, verbal cues for technique. assist to guide down to supine.  Transfers Transfers: Stand to Sit Stand to Sit: 4: Min guard;With upper extremity assist;To bed Details for Transfer Assistance: verbal cues for technique and to step toward [facility]  before sitting.        Exercises      Balance     End of Session OT - End of Session Activity Tolerance: Patient limited by pain Patient left: in bed;with call bell/phone within reach;with family/visitor present  GO     Lennox Laity [phone] 07/22/2012,  4:07 PM

## 2012-07-22 NOTE — Progress Notes (Signed)
Physical Therapy Treatment Patient Details Name: Jennifer Zavala MRN: [id number] DOB: 1916/01/17 Today's Date: 07/22/2012 Time: 4098-1191 PT Time Calculation (min): 16 min  PT Assessment / Plan / Recommendation Comments on Treatment Session  Pt demonstrates poor insight, safety awareness. Resistant to therapists suggestions for safe practices/activity if she goes home. Do not feel pt will be safe to d/c home alone, even with home health aide assistance. Pt will require 24 hour supervision/assist for safety .     Follow Up Recommendations  Skilled nursing facility;Supervision/Assistance - 24 hour    Barriers to Discharge        Equipment Recommendations  None recommended by PT    Recommendations for Other Services OT consult  Frequency Min 3X/week   Plan Discharge plan remains appropriate    Precautions / Restrictions Precautions Precautions: Fall;Shoulder Type of Shoulder Precautions: NWB L. shoulder sling Required Braces or Orthoses: Other Brace/Splint Other Brace/Splint: sling L UE Restrictions Weight Bearing Restrictions: Yes LUE Weight Bearing: Non weight bearing   Pertinent Vitals/Pain L shoulder    Mobility  Bed Mobility Bed Mobility: Not assessed Transfers Transfers: Sit to Stand;Stand to Sit Sit to Stand: 4: Min guard;With armrests;From chair/3-in-1 Stand to Sit: 4: Min guard;To chair/3-in-1;With armrests Details for Transfer Assistance: VCs safety, technique, hand placement.  Ambulation/Gait Ambulation/Gait Assistance: 4: Min assist;4: Min Government social research officer (Feet): 135 Feet Assistive device: Straight cane Ambulation/Gait Assistance Details: Attempted to have pt mobilize at independently as possible. While having pt ambulate to door and open it, pt nearly hit herself in head with door. Difficulty dual tasking (ambulating and maintaining conversation). Intermittent instability noted.  Gait Pattern: Step-through pattern;Decreased stride length      Exercises     PT Diagnosis:    PT Problem List:   PT Treatment Interventions:     PT Goals Acute Rehab PT Goals Pt will go Supine/Side to Sit: with modified independence PT Goal: Supine/Side to Sit - Progress: Progressing toward goal Pt will go Sit to Stand: with modified independence PT Goal: Sit to Stand - Progress: Progressing toward goal Pt will Ambulate: >150 feet;with modified independence;with least restrictive assistive device PT Goal: Ambulate - Progress: Progressing toward goal  Visit Information  Last PT Received On: 07/22/12 Assistance Needed: +1    Subjective Data  Subjective: "I can do it by myself" Patient Stated Goal: Home. Less pain.    Cognition  Overall Cognitive Status: Impaired Area of Impairment: Safety/judgement;Awareness of errors;Awareness of deficits;Problem solving Arousal/Alertness: Awake/alert Orientation Level: Appears intact for tasks assessed Behavior During Session: [facility] for tasks performed Safety/Judgement: Decreased awareness of safety precautions;Decreased safety judgement for tasks assessed;Decreased awareness of need for assistance Cognition - Other Comments: Pt has poor insight into extent of injury and difficulty she will face performing ADLs/mobility if home alone. Resistant to therapist's cues/suggestions/recommendations for safety/UE limitations.     Balance     End of Session PT - End of Session Equipment Utilized During Treatment: Gait belt Activity Tolerance: Patient limited by pain Patient left: in chair;with call bell/phone within reach   GP     Rebeca Alert [facility] 07/22/2012, 11:27 AM [phone]

## 2012-07-22 NOTE — Progress Notes (Signed)
Pt stable, scripts, and d/c instructions given with no questions/concerns voiced by pt or daughter.  Pt transported via wheelchair to private vehicle with NT and daughter.

## 2012-07-22 NOTE — Evaluation (Signed)
Occupational Therapy Evaluation Patient Details Name: Jennifer Zavala MRN: [id number] DOB: [birth year] Today's Date: 07/22/2012 Time: 9629-5284 OT Time Calculation (min): 38 min  OT Assessment / Plan / Recommendation Clinical Impression  78 yo female admitted after fall at home; sustained L humeral fracture. Pt currently limited with ADLs/safety with functional mobility due to only being able to move her R UE due to L shoulder precautions. Will benefit from SNF and is currently not safe to discharge home alone. Pt displays decreased safety awareness and  insight.     OT Assessment  Patient needs continued OT Services    Follow Up Recommendations  Skilled nursing facility    Barriers to Discharge      Equipment Recommendations  None recommended by PT    Recommendations for Other Services    Frequency  Min 2X/week    Precautions / Restrictions Precautions Precautions: Fall;Shoulder Type of Shoulder Precautions: NWB L. shoulder sling Required Braces or Orthoses: Other Brace/Splint Other Brace/Splint: sling L UE Restrictions Weight Bearing Restrictions: Yes LUE Weight Bearing: Non weight bearing        ADL  Eating/Feeding: Simulated;Set up Where Assessed - Eating/Feeding: Chair Grooming: Simulated;Minimal assistance Where Assessed - Grooming: Supported sitting Upper Body Bathing: Performed;Chest;Right arm;Left arm;Abdomen;Moderate assistance Where Assessed - Upper Body Bathing: Supported sitting Lower Body Bathing: Simulated;Moderate assistance Where Assessed - Lower Body Bathing: Supported sit to stand Upper Body Dressing: Simulated;Maximal assistance;Other (comment) (to doff sling, bra, shirt, and don sling, gown) Where Assessed - Upper Body Dressing: Supported sitting Lower Body Dressing: Simulated;Moderate assistance Where Assessed - Lower Body Dressing: Supported sit to stand Toilet Transfer: Simulated;Minimal assistance;Other (comment) (with SPC and verbal cues  for safety) Toileting - Clothing Manipulation and Hygiene: Simulated;Moderate assistance Where Assessed - Toileting Clothing Manipulation and Hygiene: Standing Tub/Shower Transfer Method: Not assessed Equipment Used: Cane ADL Comments: Up to ambulate with PT/OT. Pt encouraged to open door to hallway for herself for practice but when she opened it she pulled it too hard and almost hit herself in the face. Pt not understanding why she has to wear the immobilizer and asking if she can "wrap her arm in a scarf at home." Explained the purpose of the immobilizer. Pt seemed to understand better after thorough explanation as to why she has to wear the immobilizer/its purpose.    OT Diagnosis: Generalized weakness  OT Problem List: Decreased strength;Decreased range of motion;Pain;Decreased knowledge of use of DME or AE;Decreased knowledge of precautions OT Treatment Interventions: Self-care/ADL training;Therapeutic activities;DME and/or AE instruction;Patient/family education   OT Goals Acute Rehab OT Goals OT Goal Formulation: With patient Time For Goal Achievement: 08/05/12 Potential to Achieve Goals: Good ADL Goals Pt Will Perform Grooming: with supervision;Standing at sink ADL Goal: Grooming - Progress: Goal set today Pt Will Perform Upper Body Bathing: with min assist;Sitting, edge of bed;Sitting, chair ADL Goal: Upper Body Bathing - Progress: Goal set today Pt Will Perform Lower Body Bathing: with supervision;Sit to stand from chair;Sit to stand from bed ADL Goal: Lower Body Bathing - Progress: Goal set today Pt Will Perform Upper Body Dressing: with min assist;Unsupported;Other (comment) (including immobilizer) ADL Goal: Upper Body Dressing - Progress: Goal set today Pt Will Perform Lower Body Dressing: with supervision;Sit to stand from chair;Sit to stand from bed ADL Goal: Lower Body Dressing - Progress: Goal set today Pt Will Transfer to Toilet: with supervision;Ambulation;with  DME;3-in-1 ADL Goal: Toilet Transfer - Progress: Goal set today Pt Will Perform Toileting - Clothing Manipulation: with supervision;Standing ADL Goal:  Toileting - Clothing Manipulation - Progress: Goal set today  Visit Information  Last OT Received On: 07/22/12 Assistance Needed: +1 PT/OT Co-Evaluation/Treatment: Yes    Subjective Data  Subjective: why do I have to wear this thing (referring to sling). I dont like it. Patient Stated Goal: wants to go home   Prior Functioning  Vision/Perception  Home Living Lives With: Alone Type of Home: Apartment Home Access: Level entry Home Layout: One level Bathroom Shower/Tub: Engineer, manufacturing systems: Standard Home Adaptive Equipment: Walker - rolling;Straight cane;Shower chair with back;Bedside commode/3-in-1 Prior Function Level of Independence: Independent Driving: Yes Communication Communication: No difficulties      Cognition  Overall Cognitive Status: Impaired Area of Impairment: Safety/judgement;Awareness of errors;Awareness of deficits;Problem solving Arousal/Alertness: Awake/alert Orientation Level: Appears intact for tasks assessed Behavior During Session: [facility] for tasks performed Safety/Judgement: Decreased awareness of safety precautions;Decreased safety judgement for tasks assessed;Decreased awareness of need for assistance Cognition - Other Comments: Pt has poor insight into extent of injury and difficulty she will face performing ADLs/mobility if home alone. Resistant to therapist's cues/suggestions/recommendations for safety/UE limitations.     Extremity/Trunk Assessment Right Upper Extremity Assessment RUE ROM/Strength/Tone: [facility] for tasks assessed Left Upper Extremity Assessment LUE ROM/Strength/Tone: Unable to fully assess;Due to precautions LUE ROM/Strength/Tone Deficits: able to wiggle digits. no edema noted in hand.    Mobility  Shoulder Instructions  Bed Mobility Bed Mobility: Not  assessed Transfers Sit to Stand: 4: Min guard;With armrests;From chair/3-in-1 Stand to Sit: 4: Min guard;To chair/3-in-1;With armrests Details for Transfer Assistance: VCs safety, technique, hand placement.        Exercise     Balance     End of Session OT - End of Session Activity Tolerance: Patient limited by pain Patient left: in chair;with call bell/phone within reach  GO Functional Assessment Tool Used: clinical judgement Functional Limitation: Self care Self Care Current Status (Z6109): At least 40 percent but less than 60 percent impaired, limited or restricted Self Care Goal Status (U0454): At least 1 percent but less than 20 percent impaired, limited or restricted   Lennox Laity [phone] 07/22/2012, 11:43 AM

## 2012-07-22 NOTE — Progress Notes (Signed)
Pt refused SNP...homecare set up to start for Monday with [facility]..the patient requested to be discharged today and daughter stated that would stay with pt 24 hours to assist with ADLs.

## 2012-07-23 ENCOUNTER — Observation Stay (HOSPITAL_COMMUNITY)
Admission: EM | Admit: 2012-07-23 | Discharge: 2012-07-25 | Disposition: A | Discharge status: Skilled Nursing Facility | Payer: Medicare Other | Attending: Family Medicine | Admitting: Family Medicine

## 2012-07-23 DIAGNOSIS — S42309A Unspecified fracture of shaft of humerus, unspecified arm, initial encounter for closed fracture: Secondary | ICD-10-CM

## 2012-07-23 DIAGNOSIS — M79609 Pain in unspecified limb: Secondary | ICD-10-CM | POA: Insufficient documentation

## 2012-07-23 DIAGNOSIS — S42213A Unspecified displaced fracture of surgical neck of unspecified humerus, initial encounter for closed fracture: Principal | ICD-10-CM | POA: Insufficient documentation

## 2012-07-23 DIAGNOSIS — I2510 Atherosclerotic heart disease of native coronary artery without angina pectoris: Secondary | ICD-10-CM | POA: Insufficient documentation

## 2012-07-23 DIAGNOSIS — X58XXXA Exposure to other specified factors, initial encounter: Secondary | ICD-10-CM | POA: Insufficient documentation

## 2012-07-23 DIAGNOSIS — Z79899 Other long term (current) drug therapy: Secondary | ICD-10-CM | POA: Insufficient documentation

## 2012-07-23 DIAGNOSIS — E039 Hypothyroidism, unspecified: Secondary | ICD-10-CM

## 2012-07-23 DIAGNOSIS — Z7982 Long term (current) use of aspirin: Secondary | ICD-10-CM | POA: Insufficient documentation

## 2012-07-23 DIAGNOSIS — S42302A Unspecified fracture of shaft of humerus, left arm, initial encounter for closed fracture: Secondary | ICD-10-CM | POA: Diagnosis present

## 2012-07-23 DIAGNOSIS — I10 Essential (primary) hypertension: Secondary | ICD-10-CM | POA: Diagnosis present

## 2012-07-23 DIAGNOSIS — D649 Anemia, unspecified: Secondary | ICD-10-CM | POA: Insufficient documentation

## 2012-07-23 DIAGNOSIS — I719 Aortic aneurysm of unspecified site, without rupture: Secondary | ICD-10-CM

## 2012-07-23 LAB — CBC WITH DIFFERENTIAL/PLATELET
Basophils Absolute: 0 10*3/uL (ref 0.0–0.1)
Basophils Relative: 0 % (ref 0–1)
Eosinophils Absolute: 0.1 10*3/uL (ref 0.0–0.7)
MCHC: 33.2 g/dL (ref 30.0–36.0)
Neutro Abs: 6.4 10*3/uL (ref 1.7–7.7)
Neutrophils Relative %: 72 % (ref 43–77)
RDW: 14.4 % (ref 11.5–15.5)

## 2012-07-23 LAB — BASIC METABOLIC PANEL
Chloride: 105 mEq/L (ref 96–112)
Creatinine, Ser: 0.92 mg/dL (ref 0.50–1.10)
GFR calc Af Amer: 60 mL/min — ABNORMAL LOW (ref >90)
GFR calc non Af Amer: 52 mL/min — ABNORMAL LOW (ref >90)
Potassium: 3.5 mEq/L (ref 3.5–5.1)

## 2012-07-23 MED ORDER — NITROGLYCERIN 0.4 MG SL SUBL: 0.4000 mg | SUBLINGUAL_TABLET | SUBLINGUAL | Status: DC | PRN

## 2012-07-23 MED ORDER — IRBESARTAN 300 MG PO TABS
300.0000 mg | ORAL_TABLET | Freq: Every day | ORAL | Status: DC
  Administered 2012-07-24 – 2012-07-25 (×2): 300 mg via ORAL
  Filled 2012-07-23 (×3): qty 1

## 2012-07-23 MED ORDER — ACETAMINOPHEN 325 MG PO TABS
650.0000 mg | ORAL_TABLET | Freq: Four times a day (QID) | ORAL | Status: DC | PRN
  Administered 2012-07-23 – 2012-07-25 (×3): 650 mg via ORAL
  Filled 2012-07-23 (×3): qty 2

## 2012-07-23 MED ORDER — HYDROCODONE-ACETAMINOPHEN 5-325 MG PO TABS: 1.0000 | ORAL_TABLET | ORAL | Status: DC | PRN

## 2012-07-23 MED ORDER — SIMVASTATIN 40 MG PO TABS
40.0000 mg | ORAL_TABLET | Freq: Every evening | ORAL | Status: DC
  Administered 2012-07-23 – 2012-07-24 (×2): 40 mg via ORAL
  Filled 2012-07-23 (×3): qty 1

## 2012-07-23 MED ORDER — ASPIRIN EC 81 MG PO TBEC
81.0000 mg | DELAYED_RELEASE_TABLET | Freq: Every day | ORAL | Status: DC
  Filled 2012-07-23: qty 1

## 2012-07-23 MED ORDER — ATENOLOL 50 MG PO TABS
50.0000 mg | ORAL_TABLET | Freq: Every day | ORAL | Status: DC
  Administered 2012-07-23 – 2012-07-25 (×3): 50 mg via ORAL
  Filled 2012-07-23 (×3): qty 1

## 2012-07-23 MED ORDER — ONDANSETRON HCL 4 MG PO TABS: 4.0000 mg | ORAL_TABLET | Freq: Four times a day (QID) | ORAL | Status: DC | PRN

## 2012-07-23 MED ORDER — ASPIRIN EC 81 MG PO TBEC
81.0000 mg | DELAYED_RELEASE_TABLET | Freq: Every day | ORAL | Status: DC
  Administered 2012-07-24 – 2012-07-25 (×2): 81 mg via ORAL
  Filled 2012-07-23 (×2): qty 1

## 2012-07-23 MED ORDER — ACETAMINOPHEN 650 MG RE SUPP: 650.0000 mg | Freq: Four times a day (QID) | RECTAL | Status: DC | PRN

## 2012-07-23 MED ORDER — ONDANSETRON HCL 4 MG/2ML IJ SOLN: 4.0000 mg | Freq: Four times a day (QID) | INTRAMUSCULAR | Status: DC | PRN

## 2012-07-23 NOTE — ED Notes (Signed)
Patient was recently discharged on 9/7 post fall with a dx. of left humerus Fx. patient states she thought she would be able to do things herself when she got home, but the pain was to severe. Patient states can not do any ADL's by herself like grooming,toileting etc. She is here to get some "help".

## 2012-07-23 NOTE — Progress Notes (Signed)
Paged for Jennifer Zavala  Patient recently was admitted on 07/21/12 for Left humeral fracture after a mechanical fall.  At that time ER evaluation consisted of Ct of head and cervical spine which was reportedly negative.  Reportedly initial impression and plan by orthopaedic surgery suggested no surgical intervention while in house.  We had admitted patient for pain control.  Patient lives at home alone and during hospitalization was evaluated by physical therapy who recommended SNF or 24 hour supervision and assistance at home.     Patient chose to go home despite not having 24 hour supervision or assistance.  Is presenting to the ED with failure to thrive/dehydration with an elevated BUN/ Creatinine ration of > 20/1.  We have been contacted for further evaluation and admission orders.  We will request a med/surg bed. Patient is agreeable to go to SNF at this juncture per my discussion with Dr. Linwood Dibbles.  Jennifer Zavala, [employer]

## 2012-07-23 NOTE — ED Provider Notes (Signed)
History     CSN: [id number] Arrival date & time 07/23/12  1322 First MD Initiated Contact with Patient 07/23/12 1501    Chief Complaint  Patient presents with  . Illegal value: [    Social work consult for placement --patient unable to perform ADL's lives alone   HPI Pt was in the hospital recently after having a fall and sustaining a humerus fracture.  She was discharged home because she felt like she could care for herself at home.  She was released yesterday.  Pt returns today because she does not feel like she can manage at home.  Pt states she cannot groom herself, go to the bathroom etc.  Pt denies new complaints.  No fever, no vomiting.  Pt tried to sleep in her recliner but she was unable to get up.  She lives at home alone.  Past Medical History  Diagnosis Date  . Hypertension   . Aortic aneurysm   . Angina at rest   . Hypothyroid   . Coronary artery disease     Past Surgical History  Procedure Date  . Bladder surgery   . Carotid artery angioplasty   . Abdominal hysterectomy   . Cataract extraction     No family history on file.  History  Substance Use Topics  . Smoking status: Former Games developer  . Smokeless tobacco: Not on file  . Alcohol Use: No    OB History    Grav Para Term Preterm Abortions TAB SAB Ect Mult Living                  Review of Systems  All other systems reviewed and are negative.    Allergies  Sulfa antibiotics  Home Medications   Current Outpatient Rx  Name Route Sig Dispense Refill  . ACETAMINOPHEN 500 MG PO TABS Oral Take 1,000 mg by mouth every 6 (six) hours as needed. For pain    . ASPIRIN EC 81 MG PO TBEC Oral Take 81 mg by mouth daily.    . ATENOLOL 50 MG PO TABS Oral Take 50 mg by mouth daily.    Marland Kitchen VITAMIN D 1000 UNITS PO TABS Oral Take 1,000 Units by mouth daily.    . ADULT MULTIVITAMIN W/MINERALS CH Oral Take 1 tablet by mouth daily.    Marland Kitchen NITROGLYCERIN 0.4 MG SL SUBL Sublingual Place 0.4 mg under the tongue every 5 (five)  minutes as needed. For chest pain    . OLMESARTAN MEDOXOMIL 40 MG PO TABS Oral Take 20 mg by mouth daily.     . OMEGA 3-6-9 FATTY ACIDS PO LIQD Oral Take 30 mLs by mouth daily.    Marland Kitchen SIMVASTATIN 40 MG PO TABS Oral Take 40 mg by mouth every evening.      BP 138/49  Pulse 60  Temp 97.9 F (36.6 C) (Oral)  Resp 16  SpO2 91%  Physical Exam  Nursing note and vitals reviewed. Constitutional: She appears well-developed and well-nourished. No distress.  HENT:  Head: Normocephalic and atraumatic.    Right Ear: External ear normal.  Left Ear: External ear normal.  Eyes: Conjunctivae are normal. Right eye exhibits no discharge. Left eye exhibits no discharge. No scleral icterus.  Neck: Neck supple. No tracheal deviation present.  Cardiovascular: Normal rate, regular rhythm and intact distal pulses.   Pulmonary/Chest: Effort normal and breath sounds normal. No stridor. No respiratory distress. She has no wheezes. She has no rales.  Abdominal: Soft. Bowel sounds are normal. She  exhibits no distension. There is no tenderness. There is no rebound and no guarding.  Musculoskeletal: She exhibits no edema and no tenderness.       Left shoulder: She exhibits decreased range of motion, tenderness, swelling and pain. She exhibits normal pulse.  Neurological: She is alert. She has normal strength. No sensory deficit. Cranial nerve deficit:  no gross defecits noted. She exhibits normal muscle tone. She displays no seizure activity. Coordination normal.  Skin: Skin is warm and dry. No rash noted.  Psychiatric: She has a normal mood and affect.    ED Course  Procedures (including critical care time)  Labs Reviewed  CBC WITH DIFFERENTIAL - Abnormal; Notable for the following:    RBC 3.60 (*)     Hemoglobin 11.3 (*)     HCT 34.0 (*)     All other components within normal limits  BASIC METABOLIC PANEL - Abnormal; Notable for the following:    BUN 30 (*)     GFR calc non Af Amer 52 (*)     GFR calc  Af Amer 60 (*)     All other components within normal limits   No results found.    MDM  Patient was recently in the hospital after having fallen sustaining a humerus fracture she was seen in the hospital by social work and occupational therapy. He felt that her needs would require skilled nursing facility however her insurance would not pay for it because she did not be criteria. Case manager has been down to evaluate the patient. Her options will be to either pay out of pocket for skilled nursing facility versus 24-hour home care. Patient already has intermittent home health and they were the ones that found her and sent her back to the hospital. I will call the hospitalist for admission until further arrangements can be made.      Celene Kras, MD 07/23/12 [phone]

## 2012-07-23 NOTE — ED Notes (Signed)
Clinical Social Work Department BRIEF PSYCHOSOCIAL ASSESSMENT 07/23/2012  Patient:  Jennifer Zavala, Jennifer Zavala     Account Number:  [id number]     Admit date:  07/23/2012  Clinical Social Worker:  Lana Fish  Date/Time:  07/23/2012 05:06 PM  Referred by:  Physician  Date Referred:  07/23/2012 Referred for  SNF Placement   Other Referral:   Interview type:  Patient Other interview type:    PSYCHOSOCIAL DATA Living Status:  ALONE Admitted from facility:   Level of care:  Independent Living Primary support name:   Primary support relationship to patient:   Degree of support available:   Limited support provided by daughter-in-law per previous CM note from yesterday's admission.    CURRENT CONCERNS Current Concerns  Post-Acute Placement   Other Concerns:   Patient was recently admitted to hospital due to a fall and sustaining a humerus fracture. Patient was presented with the recommendation to purpose SNF placement; however pt declined due to desire to pursue alternative care that included HH. Patient returned to ED today due to realizing that she cannot provide care for herself soley at home without assistance.    SOCIAL WORK ASSESSMENT / PLAN CM and ED CSW met with pt to discuss plan of care, consisting of SNF placement (if eligible) or the option of utilizing Private Duty Agencies to provide supervision and continual care. Patient also suggested that her grandson may be able to provide care at home in the event that [facility] will not qualify her for SNF placement. Pt verbalized to CSW and CM that she desires to go to [facility] if possible.   Assessment/plan status:  Psychosocial Support/Ongoing Assessment of Needs Other assessment/ plan:   Inpatient CSW to follow up and initiate SNF process once admitted.   Information/referral to community resources:   CM provided patient with Private Duty Agencies information in the event that patient desires to utilize this service for  additional care.    PATIENT'S/FAMILY'S RESPONSE TO PLAN OF CARE: Patient is receptive to SNF placement at this time.    Janann Colonel., MSW, [facility] Clinical Social Worker [phone]

## 2012-07-23 NOTE — ED Notes (Signed)
Pt complained of feeling dizzy and a feeling of motion when she closed her eyes.  Notified Selena Batten, RN

## 2012-07-23 NOTE — H&P (Signed)
History and Physical  JARISSA SHERIFF WUJ:[id number] DOB: 06/22/1916 DOA: 07/23/2012  Referring physician: Linwood Dibbles, MD PCP: Johny Blamer, MD   Chief Complaint: arm pain  HPI:  78 year old woman presented to ED with left arm pain and difficulty caring for self. Was hospitalized 9/5-9/7 for left arm fracture. PT/OT recommended SNF or 24-hour assistance for safety. Patient did not meet admission criteria at that time (appeal by Dr. Arthor Captain was denied) and patient decided to go home with [facility].  Daughter-in-law dropped patient off last night at home, patient stayed alone. Today was unable to get out of recliner and [facility] RN recommended patient return to hospital.  Evaluation in the ED was unremarkable including: afebrile, vitals stable, CBC and BMP unremarkable. Patient specifically denies any new complaints. Only issue is pain in left arm severely limiting mobility. Patient was seen by CM and CSW. I spoke with CM Sharmon Leyden but was unable to reach CSW by telephone. Case was also discussed with referring MD and Dr. Arthor Captain. RN Selena Batten was present for history taking.  Because of difficulty caring for self, patient was referred for observation. She has required no pain medications in ED (oral or IV).  Chart Review:  As above  Review of Systems:  Negative for fever, visual changes, sore throat, rash, chest pain, SOB, dysuria, bleeding, n/v/abdominal pain, diarrhea.  Past Medical History  Diagnosis Date  . Hypertension   . Aortic aneurysm   . Angina at rest   . Hypothyroid   . Coronary artery disease     Past Surgical History  Procedure Date  . Bladder surgery   . Carotid artery angioplasty   . Abdominal hysterectomy   . Cataract extraction     Social History:  reports that she has quit smoking. She does not have any smokeless tobacco history on file. She reports that she does not drink alcohol or use illicit drugs.  Allergies  Allergen Reactions  . Sulfa Antibiotics     unknown      No family history on file.  Prior to Admission medications   Medication Sig Start Date End Date Taking? Authorizing Provider  acetaminophen (TYLENOL) 500 MG tablet Take 1,000 mg by mouth every 6 (six) hours as needed. For pain   Yes Historical Provider, MD  aspirin EC 81 MG tablet Take 81 mg by mouth daily.   Yes Historical Provider, MD  atenolol (TENORMIN) 50 MG tablet Take 50 mg by mouth daily.   Yes Historical Provider, MD  cholecalciferol (VITAMIN D) 1000 UNITS tablet Take 1,000 Units by mouth daily.   Yes Historical Provider, MD  Multiple Vitamin (MULTIVITAMIN WITH MINERALS) TABS Take 1 tablet by mouth daily.   Yes Historical Provider, MD  nitroGLYCERIN (NITROSTAT) 0.4 MG SL tablet Place 0.4 mg under the tongue every 5 (five) minutes as needed. For chest pain   Yes Historical Provider, MD  olmesartan (BENICAR) 40 MG tablet Take 20 mg by mouth daily.    Yes Historical Provider, MD  Omega 3-6-9 Fatty Acids LIQD Take 30 mLs by mouth daily.   Yes Historical Provider, MD  simvastatin (ZOCOR) 40 MG tablet Take 40 mg by mouth every evening.   Yes Historical Provider, MD   Physical Exam: Filed Vitals:   07/23/12 1329  BP: 138/49  Pulse: 60  Temp: 97.9 F (36.6 C)  TempSrc: Oral  Resp: 16  SpO2: 91%    General:  Vigorous, calm, well-appearing. Appears younger than stated age.  Eyes: PERRL, normal lids, irises, conjunctiva  ENT: normal hearing, lips, tongue  Neck: no LAD, masses or thyromegaly  Cardiovascular: RRR, no m/r/g. No LE edema  Respiratory: CTA bilaterally, no w/r/r. Normal respiratory effort.  Abdomen: soft, ntnd  Skin: no rash or induration. Bruising noted left upper arm. LUE in sling. Left hand: normal perfusion, warm, dry; grossly normal strength, no pain with movement  Musculoskeletal: as above, excellent strength RUE/BLE  Psychiatric: grossly normal mood and affect, speech fluent and appropriate  Neurologic: non-focal  Wt Readings from Last 3  Encounters:  07/21/12 68.6 kg (151 lb 3.8 oz)    Labs on Admission:  Basic Metabolic Panel:  Lab 07/23/12 1191 07/22/12 0440 07/21/12 0356 07/21/12 0005  NA 140 141 -- 138  K 3.5 3.6 -- 3.6  CL 105 105 -- 102  CO2 24 23 -- 22  GLUCOSE 90 103* -- 142*  BUN 30* 24* -- 29*  CREATININE 0.92 0.85 0.83 0.85  CALCIUM 9.1 9.0 -- 8.7  MG -- -- -- --  PHOS -- -- -- --   CBC:  Lab 07/23/12 1530 07/22/12 0440 07/21/12 0356 07/21/12 0005  WBC 8.9 8.0 9.1 8.7  NEUTROABS 6.4 -- -- 7.3  HGB 11.3* 12.6 11.9* 11.7*  HCT 34.0* 38.4 36.8 35.9*  MCV 94.4 96.7 96.3 96.0  PLT 216 191 246 264   Active Problems:  Fracture of left humerus  HTN (hypertension)   Assessment/Plan 1. Left humeral neck fracture--resulting in difficulty caring for self. Currently controlled without IV or po pain medication (at rest). 2. HTN--stable, continue olmesartan, atenolol. 3. Hypothyroidism--subclinical (not on medications). 4. History of CAD--stable. Continue ASA, atenolol.  Difficult situation, as above, case was thoroughly reviewed, discussed with previous attending physician, EDP, on-call CM, daughter-in-law at bedside and HCPOA Makaelah Cranfield by telephone (by patient request). At this point there does not appear to be criteria for inpatient admission, which I communicated to patient, POA and family. I discussed the ramifications of this with regard to SNF. However, the patient was found in her recliner by [facility] unable to stand secondary to pain. Family present feels that there is no one who can assist her at home. I offered observation with repeat meeting with CM 9/9 to discuss various private outpatient options to assist with care. Additionally will have CM/UR review obs/admission to assist in care of patient. POA agreed with plan.  Code Status: DNR Family Communication: as above Disposition Plan: pending CM discussion with patient and POA 9/9.  Time spent: 75 minutes, greater than 50% in counseling and  coordination of care.  Brendia Sacks, MD  [facility] Team 5 Pager [phone]. If 7PM-7AM, please contact night-coverage at [email or web address], password [facility] 07/23/2012, 5:30 PM

## 2012-07-23 NOTE — Care Management Note (Signed)
CM spoke with pt concerning dc planning with CSW present in room. Pt previously discharged from hospital with [facility]. Pt did not qualify for inpt status, pt refused SNF. Pt previously discharged home with Genevieve Norlander to provide [facility] services, daughter to provide first 24 hour care. ON 07/23/12  Cm received call from [place], rep Lawson Fiscal, stating [facility] arrived at patient's residence with pt unable to open door due to being in recliner for long period of time. Fire dept opened door, patient complaining of severe pain, EMS notified to bring pt back to hospital. Pt now states agrees to dc to SNF upon discharge. Pt prefers [facility]. Pt understands 3 day qualifying stay for Medicare to cover cost of SNF, pt states willing to pay grandson to provide in home care if grandson agrees to assure 24 care. Pt provided with private duty agency list in case grandson can not provide in home care. Pt explained private duty care cost is out of pocket. Cm spoke with son HPOA Trenda Corliss at [phone], cell/ horme [phone]. Pt son states to call him if any further problems with dc planning. Prefers pt to dc to SNF.   Leonie Green [phone]

## 2012-07-23 NOTE — ED Notes (Signed)
JYN:[id number]<AO> Expected date:07/23/12<BR> Expected time: 1:20 PM<BR> Means of arrival:Ambulance<BR> Comments:<BR> Safety Eval

## 2012-07-24 ENCOUNTER — Encounter (HOSPITAL_COMMUNITY): Payer: Self-pay | Admitting: Orthopedic Surgery

## 2012-07-24 DIAGNOSIS — E039 Hypothyroidism, unspecified: Secondary | ICD-10-CM

## 2012-07-24 MED ORDER — ACETAMINOPHEN 325 MG PO TABS: 650.0000 mg | ORAL_TABLET | Freq: Four times a day (QID) | ORAL | Status: AC | PRN

## 2012-07-24 NOTE — Progress Notes (Signed)
CSW spoke to pt's son.He has left messages for the billing rep at [facility] to contact him. He has not heard back from SNF as of yet. D/C Summary has been faxed to [facility]. Awaiting return call to see if d/c can take place today. SNF bed will be available tomorrow if needed.  Cori Razor LCSW [phone]

## 2012-07-24 NOTE — Progress Notes (Signed)
CSW assisting with d/c planning. Pt readmitted 07/23/12 needing ST SNF placement. Pt requesting placement at [facility]. SNF bed is available today . Pt/family understand SNF is not covered by insurance and will be paying out of pocket for this placement. Pt/family to speak with SNF to review financial requirements. CSW will continue to follow to assist with SNF placement.  Cori Razor LCSW [phone]

## 2012-07-24 NOTE — Discharge Summary (Signed)
Physician Discharge Summary  TYMARA SAUR BJY:[id number] DOB: 11/29/1915 DOA: 07/23/2012  PCP: Johny Blamer, MD  Admit date: 07/23/2012 Discharge date: 07/24/2012  Recommendations for Outpatient Follow-up:  1. Followup with Dr. Georgena Spurling, Orthopedics on 07/27/2012 at 1:15 PM. 2. Followup with Primary Medical Doctor. 3. Repeat thyroid function tests in 4-6 weeks from hospital discharge.  Discharge Diagnoses:  Active Problems:  Fracture of left humerus  HTN (hypertension)   Discharge Condition: Improved and stable.  Diet recommendation: Heart healthy diet.  Filed Weights   07/24/12 0100  Weight: 68.6 kg (151 lb 3.8 oz)    History of present illness:  78 year old woman presented to ED with left arm pain and difficulty caring for self. Was hospitalized 9/5-9/7 for left arm fracture. PT/OT recommended SNF or 24-hour assistance for safety. Patient did not meet admission criteria at that time (appeal by Dr. Arthor Captain was denied) and patient decided to go home with [facility]. Daughter-in-law dropped patient off last night at home, patient stayed alone. On day of this admission she was unable to get out of recliner and [facility] RN recommended patient return to hospital. Evaluation in the ED was unremarkable including: afebrile, vitals stable, CBC and BMP unremarkable. Patient specifically denied any new complaints. Only issue is pain in left arm severely limiting mobility. Patient was seen by CM and CSW.Because of difficulty caring for self, and because it was unsafe to send the patient home, patient was referred for observation     Hospital Course:  1. Comminuted fracture of the left humeral neck with angulation. resulting in difficulty caring for self. On previous admission a couple of days ago, her case was discussed with orthopedic M.D. who did not suggest any urgent surgery. Currently patient has only used Tylenol as needed for pain and indicates pain only with activities causing movement of the  left shoulder/arm. She has a left shoulder immobilizer sling. Discussed with Dr. Georgena Spurling who has provided an appointment to see him in his office for further evaluation and management. 2. HTN--stable, continue olmesartan, atenolol. 3. Hypothyroidism--subclinical (not on medications). Clinically looks euthyroid. Recommend repeating thyroid function tests in 6 weeks. 4. History of CAD--stable. Continue ASA, atenolol. 5. Anemia, possibly chronic: Outpatient evaluation and followup.  Procedures:  None  Consultations:  None  Discharge Exam:  Complains Pain left shoulder/upper arm with movement. Pain is controlled when she lies still and with Tylenol. Denies any other complaints.   Filed Vitals:   07/23/12 2117 07/24/12 0100 07/24/12 0525 07/24/12 1332  BP: 145/78  138/89 130/79  Pulse: 60  63 57  Temp: 98.1 F (36.7 C)  97.8 F (36.6 C) 98.1 F (36.7 C)  TempSrc: Oral  Oral   Resp: 16  16 16   Height:  5\' 4"  (1.626 m)    Weight:  68.6 kg (151 lb 3.8 oz)    SpO2: 93%  92% 92%    General exam: Moderately built and nourished female patient who looks younger than stated health, lying comfortably supine in bed in no obvious distress.  Respiratory system: Clear to auscultation. No increased work of breathing.  Cardiovascular system: S1 and S2 heard, regular rate and rhythm. No JVD, murmurs, gallops or pedal edema.  Gastrointestinal system: Abdomen is nondistended, soft and nontender. Normal bowel sounds heard.  Central nervous system: Alert and oriented x3. No focal neurological deficits.  Extremities: Left upper extremity in sling. Mild bruising of the medial aspect of the left arm. Peripheral pulses are symmetrically well felt. Patient is able  to move left fingers well with good grip. Color of the left upper extremity looks normal.   Discharge Instructions  Discharge Orders    Future Orders Please Complete By Expires   Diet - low sodium heart healthy      Increase  activity slowly      Discharge instructions      Comments:   Continue to support Left arm in [facility] Immobilizer sling.   Call MD for:  severe uncontrolled pain        Medication List  As of 07/24/2012  3:16 PM   TAKE these medications         acetaminophen 325 MG tablet   Commonly known as: TYLENOL   Take 2 tablets (650 mg total) by mouth every 6 (six) hours as needed for pain.      aspirin EC 81 MG tablet   Take 81 mg by mouth daily.      atenolol 50 MG tablet   Commonly known as: TENORMIN   Take 50 mg by mouth daily.      cholecalciferol 1000 UNITS tablet   Commonly known as: VITAMIN D   Take 1,000 Units by mouth daily.      multivitamin with minerals Tabs   Take 1 tablet by mouth daily.      nitroGLYCERIN 0.4 MG SL tablet   Commonly known as: NITROSTAT   Place 0.4 mg under the tongue every 5 (five) minutes as needed. For chest pain      olmesartan 40 MG tablet   Commonly known as: BENICAR   Take 20 mg by mouth daily.      Omega 3-6-9 Fatty Acids Liqd   Take 30 mLs by mouth daily.      simvastatin 40 MG tablet   Commonly known as: ZOCOR   Take 40 mg by mouth every evening.           Follow-up Information    Follow up with Raymon Mutton, MD on 07/27/2012. (at 1:15 pm.)    Contact information:   [address] [place] [address] [phone]       Schedule an appointment as soon as possible for a visit with Johny Blamer, MD.   Contact information:   [facility] [address] [place] [address] [phone]           The results of significant diagnostics from this hospitalization (including imaging, microbiology, ancillary and laboratory) are listed below for reference.    Significant Diagnostic Studies: Ct Head Wo Contrast  07/21/2012  *RADIOLOGY REPORT*  Clinical Data:  Dizziness with fall.  Left shoulder pain.  CT HEAD WITHOUT CONTRAST CT CERVICAL SPINE WITHOUT CONTRAST   Technique:  Multidetector CT imaging of the head and cervical spine was performed following the standard protocol without intravenous contrast.  Multiplanar CT image reconstructions of the cervical spine were also generated.  Comparison:   None  CT HEAD  Findings: There is no evidence of acute intracranial hemorrhage, mass effect, brain edema or extra-axial fluid collection.  The ventricles and subarachnoid spaces are diffusely prominent consistent with mild atrophy. Diffuse periventricular low density is most compatible with moderate chronic small vessel ischemic change.   No acute cortical infarction is seen.  There is right maxillary sinus mucosal thickening which appears chronic based on the small size of the right maxillary sinus.  No air-fluid levels are identified.  Intracranial vascular calcifications are noted. The calvarium is intact.  IMPRESSION:  1.  No acute intracranial or calvarial findings. 2.  Atrophy and chronic small vessel ischemic changes. 3.  Chronic-appearing right maxillary sinus mucosal thickening.  CT CERVICAL SPINE  Findings: There is no evidence of acute cervical spine fracture or traumatic subluxation.  There is mild spondylosis with disc space loss most advanced at C5-C6.  There is physiologic rotation at C1- C2.  Scattered facet degenerative changes are present.  No acute soft tissue findings are evident.  The patient appears to be status post left thyroid resection.  IMPRESSION: No evidence of a acute cervical spine fracture, traumatic subluxation or static signs of instability.  Spondylosis as described.   Original Report Authenticated By: Gerrianne Scale, M.D.    Ct Cervical Spine Wo Contrast  07/21/2012  *RADIOLOGY REPORT*  Clinical Data:  Dizziness with fall.  Left shoulder pain.  CT HEAD WITHOUT CONTRAST CT CERVICAL SPINE WITHOUT CONTRAST  Technique:  Multidetector CT imaging of the head and cervical spine was performed following the standard protocol without intravenous  contrast.  Multiplanar CT image reconstructions of the cervical spine were also generated.  Comparison:   None  CT HEAD  Findings: There is no evidence of acute intracranial hemorrhage, mass effect, brain edema or extra-axial fluid collection.  The ventricles and subarachnoid spaces are diffusely prominent consistent with mild atrophy. Diffuse periventricular low density is most compatible with moderate chronic small vessel ischemic change.   No acute cortical infarction is seen.  There is right maxillary sinus mucosal thickening which appears chronic based on the small size of the right maxillary sinus.  No air-fluid levels are identified.  Intracranial vascular calcifications are noted. The calvarium is intact.  IMPRESSION:  1.  No acute intracranial or calvarial findings. 2.  Atrophy and chronic small vessel ischemic changes. 3.  Chronic-appearing right maxillary sinus mucosal thickening.  CT CERVICAL SPINE  Findings: There is no evidence of acute cervical spine fracture or traumatic subluxation.  There is mild spondylosis with disc space loss most advanced at C5-C6.  There is physiologic rotation at C1- C2.  Scattered facet degenerative changes are present.  No acute soft tissue findings are evident.  The patient appears to be status post left thyroid resection.  IMPRESSION: No evidence of a acute cervical spine fracture, traumatic subluxation or static signs of instability.  Spondylosis as described.   Original Report Authenticated By: Gerrianne Scale, M.D.    Dg Shoulder Left  07/20/2012  *RADIOLOGY REPORT*  Clinical Data: Left shoulder pain status post fall.  LEFT SHOULDER - 2+ VIEW  Comparison: None.  Findings: Positioning is limited by the patient's injury.  There is a comminuted and angulated fracture of the humeral neck.  There is no gross dislocation.  The subacromial space appears narrowed.  On the Y-view, there is possible irregularity of the posterior cortex of the scapula.  IMPRESSION:  1.   Comminuted fracture of the left humeral neck with angulation. 2.  Cannot exclude a scapular fracture, not optimally evaluated by this examination.   Original Report Authenticated By: Gerrianne Scale, M.D.     Microbiology: No results found for this or any previous visit (from the past 240 hour(s)).   Labs: Basic Metabolic Panel:  Lab 07/23/12 1610 07/22/12 0440 07/21/12 0356 07/21/12 0005  NA 140 141 -- 138  K 3.5 3.6 -- 3.6  CL 105 105 -- 102  CO2 24 23 -- 22  GLUCOSE 90 103* -- 142*  BUN 30* 24* -- 29*  CREATININE 0.92  0.85 0.83 0.85  CALCIUM 9.1 9.0 -- 8.7  MG -- -- -- --  PHOS -- -- -- --   Liver Function Tests: No results found for this basename: AST:5,ALT:5,ALKPHOS:5,BILITOT:5,PROT:5,ALBUMIN:5 in the last 168 hours No results found for this basename: LIPASE:5,AMYLASE:5 in the last 168 hours No results found for this basename: AMMONIA:5 in the last 168 hours CBC:  Lab 07/23/12 1530 07/22/12 0440 07/21/12 0356 07/21/12 0005  WBC 8.9 8.0 9.1 8.7  NEUTROABS 6.4 -- -- 7.3  HGB 11.3* 12.6 11.9* 11.7*  HCT 34.0* 38.4 36.8 35.9*  MCV 94.4 96.7 96.3 96.0  PLT 216 191 246 264   Cardiac Enzymes: No results found for this basename: CKTOTAL:5,CKMB:5,CKMBINDEX:5,TROPONINI:5 in the last 168 hours BNP: BNP (last 3 results) No results found for this basename: PROBNP:3 in the last 8760 hours CBG: No results found for this basename: GLUCAP:5 in the last 168 hours  Other lab tests  1. TSH: 7.029, free T4-1.14 and free T3-2.7  Discussed with patient's son Mr. Shelbee Apgar over the phone, updated care and answered questions.  Time coordinating discharge: Less than 30 minutes  Signed:  Anamari Galeas  [facility] 07/24/2012, 3:16 PM

## 2012-07-25 DIAGNOSIS — I10 Essential (primary) hypertension: Secondary | ICD-10-CM

## 2012-07-25 DIAGNOSIS — S42309A Unspecified fracture of shaft of humerus, unspecified arm, initial encounter for closed fracture: Secondary | ICD-10-CM

## 2012-07-25 NOTE — Progress Notes (Signed)
Message received from [facility]. Coverage for SNF placement has been worked out with pt/son and [facility] is able to admit pt this am. CSW will assist with d/c to SNF this morning.  Cori Razor LCSW [phone]

## 2012-07-25 NOTE — Progress Notes (Signed)
Pt d/c to [facility] this am via [facility]. Pt/family were in agreement with d/c plan.  Cori Razor LCSW [phone]

## 2017-10-13 ENCOUNTER — Encounter (HOSPITAL_COMMUNITY): Payer: Self-pay

## 2017-10-13 ENCOUNTER — Inpatient Hospital Stay (HOSPITAL_COMMUNITY)
Admission: EM | Admit: 2017-10-13 | Discharge: 2017-10-18 | DRG: 064 | Disposition: A | Discharge status: Skilled Nursing Facility | Payer: Medicare Other | Attending: Internal Medicine | Admitting: Internal Medicine

## 2017-10-13 ENCOUNTER — Other Ambulatory Visit: Payer: Self-pay

## 2017-10-13 ENCOUNTER — Emergency Department (HOSPITAL_COMMUNITY): Payer: Medicare Other

## 2017-10-13 DIAGNOSIS — Z881 Allergy status to other antibiotic agents status: Secondary | ICD-10-CM

## 2017-10-13 DIAGNOSIS — N183 Chronic kidney disease, stage 3 unspecified: Secondary | ICD-10-CM | POA: Diagnosis present

## 2017-10-13 DIAGNOSIS — I4891 Unspecified atrial fibrillation: Secondary | ICD-10-CM | POA: Diagnosis present

## 2017-10-13 DIAGNOSIS — Z66 Do not resuscitate: Secondary | ICD-10-CM | POA: Diagnosis present

## 2017-10-13 DIAGNOSIS — E872 Acidosis, unspecified: Secondary | ICD-10-CM | POA: Diagnosis present

## 2017-10-13 DIAGNOSIS — Z87891 Personal history of nicotine dependence: Secondary | ICD-10-CM

## 2017-10-13 DIAGNOSIS — R0902 Hypoxemia: Secondary | ICD-10-CM

## 2017-10-13 DIAGNOSIS — R29713 NIHSS score 13: Secondary | ICD-10-CM | POA: Diagnosis present

## 2017-10-13 DIAGNOSIS — Z90710 Acquired absence of both cervix and uterus: Secondary | ICD-10-CM | POA: Diagnosis not present

## 2017-10-13 DIAGNOSIS — E860 Dehydration: Secondary | ICD-10-CM | POA: Diagnosis present

## 2017-10-13 DIAGNOSIS — N179 Acute kidney failure, unspecified: Secondary | ICD-10-CM

## 2017-10-13 DIAGNOSIS — G92 Toxic encephalopathy: Secondary | ICD-10-CM | POA: Diagnosis present

## 2017-10-13 DIAGNOSIS — I129 Hypertensive chronic kidney disease with stage 1 through stage 4 chronic kidney disease, or unspecified chronic kidney disease: Secondary | ICD-10-CM | POA: Diagnosis present

## 2017-10-13 DIAGNOSIS — Z7189 Other specified counseling: Secondary | ICD-10-CM | POA: Diagnosis not present

## 2017-10-13 DIAGNOSIS — R414 Neurologic neglect syndrome: Secondary | ICD-10-CM | POA: Diagnosis present

## 2017-10-13 DIAGNOSIS — Z515 Encounter for palliative care: Secondary | ICD-10-CM | POA: Diagnosis not present

## 2017-10-13 DIAGNOSIS — I361 Nonrheumatic tricuspid (valve) insufficiency: Secondary | ICD-10-CM | POA: Diagnosis not present

## 2017-10-13 DIAGNOSIS — R471 Dysarthria and anarthria: Secondary | ICD-10-CM | POA: Diagnosis present

## 2017-10-13 DIAGNOSIS — R627 Adult failure to thrive: Secondary | ICD-10-CM | POA: Diagnosis present

## 2017-10-13 DIAGNOSIS — Z7982 Long term (current) use of aspirin: Secondary | ICD-10-CM

## 2017-10-13 DIAGNOSIS — I63411 Cerebral infarction due to embolism of right middle cerebral artery: Secondary | ICD-10-CM | POA: Diagnosis present

## 2017-10-13 DIAGNOSIS — E039 Hypothyroidism, unspecified: Secondary | ICD-10-CM | POA: Diagnosis present

## 2017-10-13 DIAGNOSIS — G928 Other toxic encephalopathy: Secondary | ICD-10-CM | POA: Diagnosis present

## 2017-10-13 DIAGNOSIS — J810 Acute pulmonary edema: Secondary | ICD-10-CM | POA: Diagnosis not present

## 2017-10-13 DIAGNOSIS — G8194 Hemiplegia, unspecified affecting left nondominant side: Secondary | ICD-10-CM | POA: Diagnosis present

## 2017-10-13 DIAGNOSIS — E8770 Fluid overload, unspecified: Secondary | ICD-10-CM | POA: Diagnosis not present

## 2017-10-13 DIAGNOSIS — R1310 Dysphagia, unspecified: Secondary | ICD-10-CM | POA: Diagnosis not present

## 2017-10-13 DIAGNOSIS — N390 Urinary tract infection, site not specified: Secondary | ICD-10-CM

## 2017-10-13 DIAGNOSIS — I63511 Cerebral infarction due to unspecified occlusion or stenosis of right middle cerebral artery: Secondary | ICD-10-CM

## 2017-10-13 DIAGNOSIS — E785 Hyperlipidemia, unspecified: Secondary | ICD-10-CM

## 2017-10-13 DIAGNOSIS — J9601 Acute respiratory failure with hypoxia: Secondary | ICD-10-CM | POA: Diagnosis present

## 2017-10-13 DIAGNOSIS — R1312 Dysphagia, oropharyngeal phase: Secondary | ICD-10-CM | POA: Diagnosis not present

## 2017-10-13 DIAGNOSIS — I10 Essential (primary) hypertension: Secondary | ICD-10-CM | POA: Diagnosis not present

## 2017-10-13 DIAGNOSIS — I719 Aortic aneurysm of unspecified site, without rupture: Secondary | ICD-10-CM | POA: Diagnosis present

## 2017-10-13 DIAGNOSIS — Z79899 Other long term (current) drug therapy: Secondary | ICD-10-CM

## 2017-10-13 DIAGNOSIS — I639 Cerebral infarction, unspecified: Secondary | ICD-10-CM

## 2017-10-13 DIAGNOSIS — I2510 Atherosclerotic heart disease of native coronary artery without angina pectoris: Secondary | ICD-10-CM | POA: Diagnosis present

## 2017-10-13 HISTORY — DX: Chronic kidney disease, stage 3 unspecified: N18.30

## 2017-10-13 HISTORY — DX: Acute kidney failure, unspecified: N17.9

## 2017-10-13 HISTORY — DX: Urinary tract infection, site not specified: N39.0

## 2017-10-13 HISTORY — DX: Cerebral infarction due to unspecified occlusion or stenosis of right middle cerebral artery: I63.511

## 2017-10-13 HISTORY — DX: Other toxic encephalopathy: G92.8

## 2017-10-13 LAB — COMPREHENSIVE METABOLIC PANEL
ALK PHOS: 111 U/L (ref 38–126)
ALT: 72 U/L — ABNORMAL HIGH (ref 14–54)
ANION GAP: 14 (ref 5–15)
AST: 64 U/L — ABNORMAL HIGH (ref 15–41)
Albumin: 4 g/dL (ref 3.5–5.0)
BUN: 58 mg/dL — ABNORMAL HIGH (ref 6–20)
CALCIUM: 9 mg/dL (ref 8.9–10.3)
CHLORIDE: 106 mmol/L (ref 101–111)
CO2: 19 mmol/L — ABNORMAL LOW (ref 22–32)
CREATININE: 1.55 mg/dL — AB (ref 0.44–1.00)
GFR, EST AFRICAN AMERICAN: 31 mL/min — AB (ref >60)
GFR, EST NON AFRICAN AMERICAN: 27 mL/min — AB (ref >60)
Glucose, Bld: 158 mg/dL — ABNORMAL HIGH (ref 65–99)
Potassium: 4.6 mmol/L (ref 3.5–5.1)
SODIUM: 139 mmol/L (ref 135–145)
Total Bilirubin: 1.3 mg/dL — ABNORMAL HIGH (ref 0.3–1.2)
Total Protein: 7.7 g/dL (ref 6.5–8.1)

## 2017-10-13 LAB — CBC WITH DIFFERENTIAL/PLATELET
Basophils Absolute: 0 10*3/uL (ref 0.0–0.1)
Basophils Relative: 0 %
EOS ABS: 0 10*3/uL (ref 0.0–0.7)
EOS PCT: 0 %
HCT: 36.8 % (ref 36.0–46.0)
Hemoglobin: 12 g/dL (ref 12.0–15.0)
LYMPHS PCT: 11 %
Lymphs Abs: 1 10*3/uL (ref 0.7–4.0)
MCH: 31.7 pg (ref 26.0–34.0)
MCHC: 32.6 g/dL (ref 30.0–36.0)
MCV: 97.4 fL (ref 78.0–100.0)
MONOS PCT: 5 %
Monocytes Absolute: 0.4 10*3/uL (ref 0.1–1.0)
Neutro Abs: 7.3 10*3/uL (ref 1.7–7.7)
Neutrophils Relative %: 84 %
PLATELETS: 289 10*3/uL (ref 150–400)
RBC: 3.78 MIL/uL — AB (ref 3.87–5.11)
RDW: 16.5 % — ABNORMAL HIGH (ref 11.5–15.5)
WBC: 8.8 10*3/uL (ref 4.0–10.5)

## 2017-10-13 LAB — URINALYSIS, ROUTINE W REFLEX MICROSCOPIC
Bilirubin Urine: NEGATIVE
GLUCOSE, UA: NEGATIVE mg/dL
Hgb urine dipstick: NEGATIVE
Ketones, ur: 5 mg/dL — AB
Leukocytes, UA: NEGATIVE
Nitrite: NEGATIVE
PH: 5 (ref 5.0–8.0)
Protein, ur: 100 mg/dL — AB
SPECIFIC GRAVITY, URINE: 1.018 (ref 1.005–1.030)

## 2017-10-13 LAB — I-STAT CG4 LACTIC ACID, ED
LACTIC ACID, VENOUS: 4.13 mmol/L — AB (ref 0.5–1.9)
LACTIC ACID, VENOUS: 4.62 mmol/L — AB (ref 0.5–1.9)
Lactic Acid, Venous: 3.79 mmol/L (ref 0.5–1.9)

## 2017-10-13 LAB — TSH: TSH: 2.146 u[IU]/mL (ref 0.350–4.500)

## 2017-10-13 MED ORDER — SODIUM CHLORIDE 0.9 % IV BOLUS (SEPSIS)
1000.0000 mL | Freq: Once | INTRAVENOUS | Status: AC
  Administered 2017-10-13: 1000 mL via INTRAVENOUS

## 2017-10-13 MED ORDER — DEXTROSE 5 % IV SOLN
2.0000 g | Freq: Once | INTRAVENOUS | Status: AC
  Administered 2017-10-13: 2 g via INTRAVENOUS
  Filled 2017-10-13: qty 2

## 2017-10-13 MED ORDER — LORAZEPAM 2 MG/ML IJ SOLN
0.5000 mg | Freq: Once | INTRAMUSCULAR | Status: AC
  Administered 2017-10-13: 0.5 mg via INTRAVENOUS
  Filled 2017-10-13: qty 1

## 2017-10-13 MED ORDER — SODIUM CHLORIDE 0.9 % IV BOLUS (SEPSIS)
250.0000 mL | Freq: Once | INTRAVENOUS | Status: AC
  Administered 2017-10-13: 250 mL via INTRAVENOUS

## 2017-10-13 NOTE — H&P (Signed)
History and Physical    Jennifer Zavala XBM:[id number] DOB: 1916/01/13 DOA: 10/13/2017  PCP: Johny BlamerHarris, William, MD   I have briefly reviewed patients [facility].  Patient coming from: Independent living facility.  Chief Complaint: Acute Changes in mentation, failure to thrive.  HPI: Jennifer MiloMarguerite H Zavala is a 83 year old woman with a past medical history significant for hypertension, hypothyroidism, coronary artery disease and hyperlipidemia; who presented to ED from her independent living facility due to failure to thrive and changes in mentation.  Apparently patient has been refusing to do activities that she normally enjoyed and has been able to performed without much assistance.  She has not bee eating, not drinking, essentially confused and moaning and having difficulty expressing herself at times.  On Sunday she even refused to go to church which is something that she regularly does with another family member.  Patient's symptoms continue essentially progressing and becoming even more alarming and the patient was transferred to the ED for further evaluation.  There has not been any fever, coughing, headaches, complains of chest pain, shortness of breath, nausea, vomiting, hematemesis, melena, hematochezia or hematuria.  At the facility they reported increase in urinary frequency.  ED Course: CT scan of the head positive for acute right MCA, neurology was consulted and a stroke protocol essentially initiated.  Patient also with urinalysis that demonstrated concern for UTI and given elevated lactic acid she received IV fluids and was empirically started on Rocephin after blood cultures and urine culture was taken.  Review of Systems:  All other systems reviewed and apart from HPI, are negative.  Past Medical History:  Diagnosis Date  . Angina at rest [facility](HCC)   . Aortic aneurysm (HCC)   . Coronary artery disease   . Hypertension   . Hypothyroid      Past Surgical History:  Procedure Laterality Date  . ABDOMINAL HYSTERECTOMY    . BLADDER SURGERY    . CAROTID ARTERY ANGIOPLASTY    . CATARACT EXTRACTION      Social History  reports that she has quit smoking. she has never used smokeless tobacco. She reports that she does not drink alcohol or use drugs.  Allergies  Allergen Reactions  . Sulfa Antibiotics     unknown    Family history: unable to be reviewed given the patient's current mentation.  Looking on old charts appears to be positive for hypertension and cholesterol.  (Given patient's age might not be contributory at this point).  Prior to Admission medications   Medication Sig Start Date End Date Taking? Authorizing Provider  aspirin EC 81 MG tablet Take 81 mg by mouth daily.   Yes [provider]  atenolol (TENORMIN) 50 MG tablet Take 50 mg by mouth daily.   Yes [provider]  ENSURE PLUS (ENSURE PLUS) LIQD Take 237 mLs by mouth 3 (three) times daily between meals.   Yes [provider]  levothyroxine (SYNTHROID, LEVOTHROID) 50 MCG tablet Take 50 mcg by mouth daily before breakfast.   Yes [provider]  Multiple Vitamin (MULTIVITAMIN WITH MINERALS) TABS Take 1 tablet by mouth daily.   Yes [provider]  nitroGLYCERIN (NITROSTAT) 0.4 MG SL tablet Place 0.4 mg under the tongue every 5 (five) minutes as needed. For chest pain   Yes [provider]  cholecalciferol (VITAMIN D) 1000 UNITS tablet Take 1,000 Units by mouth daily.    [provider]  levothyroxine (SYNTHROID, LEVOTHROID) 25 MCG tablet Take 25  mcg by mouth daily before breakfast.    [provider]  olmesartan (BENICAR) 40 MG tablet Take 20 mg by mouth daily.     [provider]  Omega 3-6-9 Fatty Acids LIQD Take 30 mLs by mouth daily.    [provider]  pravastatin (PRAVACHOL) 40 MG tablet Take 40 mg by mouth daily.    [provider]  simvastatin (ZOCOR)  40 MG tablet Take 40 mg by mouth every evening.    [provider]    Physical Exam: Vitals:   10/13/17 1345 10/13/17 1430 10/13/17 1600 10/13/17 1630  BP: 98/87 (!) 94/48 (!) 134/99 (!) 136/97  Pulse:  68  (!) 168  Resp: (!) 32 20 (!) 23 (!) 27  Temp:      TempSrc:      SpO2:  (!) 79%  100%   Constitutional: Oriented x1, poor insight, was able to follow simple commands but neglecting left side was appreciated.  No icterus, no nystagmus, EOMI ENMT: Dry mucous membranes, cracked lips and xerostomia appreciated.  No erythema, no thrush, no exudates.  Neck: No bruits, no thyromegaly, no masses.  Left side scar from prior endarterectomy.   Respiratory: clear to auscultation bilaterally, no wheezing, no crackles. Normal respiratory effort. No accessory muscle use.  Cardiovascular: Irregular, no rubs, no gallops, no appreciated murmur.  No JVD seen on exam.   Abdomen: Soft, non-tenderness, no distention, positive bowel sounds.  No hepatosplenomegaly appreciated on exam.    Musculoskeletal: no clubbing / cyanosis. No joint deformity upper and lower extremities. Good ROM, no contractures. Normal muscle tone.  Skin: no rashes, lesions, ulcers. No induration Neurologic: CN 2-12 appear intact. Strength 4-5/5 in right upper and right lower extremities; 3/5 on her left upper and lower extremities.  Patient able to move 4 limbs spontaneously but is either no comprehending or neglecting when asked to participate on certain activities with her left side. Psychiatric: Poor insight, oriented to person and was able to express that [facility]; no hallucinations.    Labs on Admission: I have personally reviewed following labs and imaging studies  CBC: Recent Labs  Lab 10/13/17 1243  WBC 8.8  NEUTROABS 7.3  HGB 12.0  HCT 36.8  MCV 97.4  PLT 289   Basic Metabolic Panel: Recent Labs  Lab 10/13/17 1243  NA 139  K 4.6  CL 106  CO2 19*  GLUCOSE 158*  BUN 58*  CREATININE  1.55*  CALCIUM 9.0   Liver Function Tests: Recent Labs  Lab 10/13/17 1243  AST 64*  ALT 72*  ALKPHOS 111  BILITOT 1.3*  PROT 7.7  ALBUMIN 4.0   Urine analysis:    Component Value Date/Time   COLORURINE AMBER (A) 10/13/2017 1245   APPEARANCEUR HAZY (A) 10/13/2017 1245   LABSPEC 1.018 10/13/2017 1245   PHURINE 5.0 10/13/2017 1245   GLUCOSEU NEGATIVE 10/13/2017 1245   HGBUR NEGATIVE 10/13/2017 1245   BILIRUBINUR NEGATIVE 10/13/2017 1245   KETONESUR 5 (A) 10/13/2017 1245   PROTEINUR 100 (A) 10/13/2017 1245   NITRITE NEGATIVE 10/13/2017 1245   LEUKOCYTESUR NEGATIVE 10/13/2017 1245     Radiological Exams on Admission: Ct Head Wo Contrast  Result Date: 10/13/2017 CLINICAL DATA:  Mental status changes. EXAM: CT HEAD WITHOUT CONTRAST TECHNIQUE: Contiguous axial images were obtained from the base of the skull through the vertex without intravenous contrast. COMPARISON:  07/21/2012 FINDINGS: Brain: Moderate low density in the periventricular white matter likely related to small vessel disease.  Cortically based infarct involving the posterior right MCA distribution, including on image 18/series 2. Sulci effacement. No midline shift. Expected cerebral and cerebellar volume loss. No hydrocephalus, hemorrhage, intra-axial, or extra-axial fluid collection. Vascular: Intracranial atherosclerosis. Skull: Normal Sinuses/Orbits: Normal imaged portions of the orbits and globes. Clear paranasal sinuses and mastoid air cells. Other: None. IMPRESSION: 1. Right posterior middle cerebral artery distribution cortically based infarct, favored to be subacute. Sulci effacement but no significant midline shift. 2.  Cerebral atrophy and small vessel ischemic change. These results were called by telephone at the time of interpretation on 10/13/2017 at 3:53 pm to Dr. Alvira Monday , who verbally acknowledged these results. Electronically Signed   By: Jeronimo Greaves M.D.   On: 10/13/2017 15:55    EKG:  Atrial  fibrillation and right bundle branch block appreciated on EKG, no prior EKG in our system for comparison.  Assessment/Plan 1-acute right MCA stroke [facility]): big stroke seen on the right MCA distribution on CT scan. -Patient last seen normal approximately 3-4 days ago.  Completely out of the therapeutic window for TPA. -Given the patient's stroke size also high risk for hemorrhagic conversion. -Neurology has evaluated the patient and recommended aspirin 300 mg rectally or 325mg  orally if she passed swallowing evaluation. -Following a stroke protocol will check MRI/MRA, 2D echo, carotid Dopplers -Patient will be kept on telemetry and will be permissive with hypertension in the setting of ischemic event -(of note, patient was actively taking baby aspirin on daily basis)  2-atrial fibrillation -New for the patient I am most likely the reason for her acute ischemic stroke. -Will hold on anticoagulation given the size of her stroke and increase chances for hemorrhagic conversion -Will use aspirin -Currently without RVR; will start patient on metoprolol IV 2.5 mg every 8 hours and follow heart rate. -Will check TSH and 2D echo.  3-HTN (hypertension): -Holding oral antihypertensive regimen while n.p.o. Permissive hypertension in the setting of ischemic stroke -Blood pressure stable most likely will stay control with the use of IV metoprolol  4-hypothyroidism -Continue Synthroid (given IV for now). -Follow TSH  5- Acute lower UTI -Patient denies dysuria but have a dirty urine and reports increased frequency. -Urine culture has been ordered -Will continue empiric IV Rocephin.  6-Lactic acidosis -IV fluids given in the ED -Appears to be secondary to dehydration/infection -Will continue gentle fluid resuscitation. -Follow lactic acid level.  7-Toxic metabolic encephalopathy: In the setting of acute stroke and presumed underlying UTI -Continue Rocephin -IV fluids and further workup as  mentioned above for acute/subacute stroke  8-Acute renal failure superimposed on stage 3 chronic kidney disease (HCC) -Will provide IV fluids and hold nephrotoxic agents. -Follow basic metabolic panel to assess renal function trend.  9-Hyperlipidemia -Plan is to continue statins once able to take by mouth.   Time: 70 minutes.   DVT prophylaxis: SCD's Code Status: Partial code (okay with intubation, BiPAP and ACLS medications) Family Communication: No family at bedside. Disposition Plan: To be determined.  Came from independent living facility; but the prognosis is undetermined currently.  Will admit to [facility] to perform stroke workup and further determine intervention for Ms. Hollice Espy. Consults called: Neurology and palliative care Admission status: Inpatient, stepdown, LOS > 2 midnight   Vassie Loll MD [facility] Pager [phone]  If 7PM-7AM, please contact night-coverage [email or web address] Password [email or web address]  10/13/2017, 6:45 PM

## 2017-10-13 NOTE — ED Notes (Signed)
Bed: [id number] Expected date:  Expected time:  Means of arrival: Ambulance Comments: EMS

## 2017-10-13 NOTE — ED Notes (Signed)
Attempted to call report charge nurse unable to take at this time.

## 2017-10-13 NOTE — ED Provider Notes (Signed)
[facility]-EMERGENCY DEPT Provider Note   CSN: [id number] Arrival date & time: 10/13/17  1159     History   Chief Complaint Chief Complaint  Patient presents with  . Altered Mental Status    HPI Jennifer Zavala is a 83 y.o. female.  HPI   Today, not self, moaning and groaning not answering questions, not acting herself Friday seemed a little fatigued Sunday didn't feel like going to church, reported was too dark and cold and that was not normal  At baseline lives independently in senior living facility, is alert, no history of dementia. Daughter in law normally eats with her on Thursdays and went to see her today and found her acting completely differently than baseline No known infectious symptoms, no known falls or chest pain No recent medication changes Unknown when last normal Facility had reported that over last few weeks has been missing meals more.    Past Medical History:  Diagnosis Date  . Angina at rest [facility])   . Aortic aneurysm (HCC)   . Coronary artery disease   . Hypertension   . Hypothyroid     Patient Active Problem List   Diagnosis Date Noted  . Acute right MCA stroke (HCC) 10/13/2017  . Acute lower UTI 10/13/2017  . Lactic acidosis 10/13/2017  . Toxic metabolic encephalopathy 10/13/2017  . Acute renal failure superimposed on stage 3 chronic kidney disease (HCC) 10/13/2017  . Fracture of left humerus 07/21/2012  . HTN (hypertension) 07/21/2012  . Hypothyroidism 07/21/2012  . Aortic aneurysm (HCC) 07/21/2012    Past Surgical History:  Procedure Laterality Date  . ABDOMINAL HYSTERECTOMY    . BLADDER SURGERY    . CAROTID ARTERY ANGIOPLASTY    . CATARACT EXTRACTION      OB History    No data available       Home Medications    Prior to Admission medications   Medication Sig Start Date End Date Taking? Authorizing Provider  aspirin EC 81 MG tablet Take 81 mg by mouth daily.   Yes [provider]    atenolol (TENORMIN) 50 MG tablet Take 50 mg by mouth daily.   Yes [provider]  ENSURE PLUS (ENSURE PLUS) LIQD Take 237 mLs by mouth 3 (three) times daily between meals.   Yes [provider]  levothyroxine (SYNTHROID, LEVOTHROID) 50 MCG tablet Take 50 mcg by mouth daily before breakfast.   Yes [provider]  Multiple Vitamin (MULTIVITAMIN WITH MINERALS) TABS Take 1 tablet by mouth daily.   Yes [provider]  nitroGLYCERIN (NITROSTAT) 0.4 MG SL tablet Place 0.4 mg under the tongue every 5 (five) minutes as needed. For chest pain   Yes [provider]  cholecalciferol (VITAMIN D) 1000 UNITS tablet Take 1,000 Units by mouth daily.    [provider]  levothyroxine (SYNTHROID, LEVOTHROID) 25 MCG tablet Take 25 mcg by mouth daily before breakfast.    [provider]  olmesartan (BENICAR) 40 MG tablet Take 20 mg by mouth daily.     [provider]  Omega 3-6-9 Fatty Acids LIQD Take 30 mLs by mouth daily.    [provider]  pravastatin (PRAVACHOL) 40 MG tablet Take 40 mg by mouth daily.    [provider]  simvastatin (ZOCOR) 40 MG tablet Take 40 mg by mouth every evening.    [provider]    Family History History reviewed. No pertinent family history.  Social History Social History  Tobacco Use  . Smoking status: Former Games developermoker  . Smokeless tobacco: Never Used  Substance Use Topics  . Alcohol use: No  . Drug use: No     Allergies   Sulfa antibiotics   Review of Systems Review of Systems  Unable to perform ROS: Mental status change     Physical Exam Updated Vital Signs BP (!) 136/97   Pulse (!) 168   Temp (!) 96.7 F (35.9 C) (Rectal)   Resp (!) 27   SpO2 100%   Physical Exam  Constitutional: She appears well-developed and well-nourished. She has a sickly appearance. She appears ill. She appears distressed (moaning, not sitting still).  HENT:  Head: Normocephalic  and atraumatic.  Eyes: Conjunctivae and EOM are normal.  Neck: Normal range of motion.  Cardiovascular: Normal heart sounds and intact distal pulses. A regularly irregular rhythm present. Tachycardia present. Exam reveals no gallop and no friction rub.  No murmur heard. Pulmonary/Chest: Effort normal and breath sounds normal. No respiratory distress. She has no wheezes. She has no rales.  Abdominal: Soft. She exhibits no distension. There is no tenderness. There is no guarding.  Musculoskeletal: She exhibits no edema or tenderness.  Neurological: She is alert. GCS eye subscore is 4. GCS verbal subscore is 4. GCS motor subscore is 6.  Patient moaning, following intermittent commands Normal strength noted right side Moving both legs spontaneously, moving both arms spontaneously When lifting arms for pronator drift, unclear if patient with weakness on left or if having difficulty following commands, will hold up her arm then place it on her head Shows neglect of left side Reports feeling right leg, when touching left states "I don't know" . Difficulty following commands on left but shows overall confusion, moaning, exam limited   Skin: Skin is warm and dry. No rash noted. She is not diaphoretic. No erythema.  Nursing note and vitals reviewed.    ED Treatments / Results  Labs (all labs ordered are listed, but only abnormal results are displayed) Labs Reviewed  CBC WITH DIFFERENTIAL/PLATELET - Abnormal; Notable for the following components:      Result Value   RBC 3.78 (*)    RDW 16.5 (*)    All other components within normal limits  COMPREHENSIVE METABOLIC PANEL - Abnormal; Notable for the following components:   CO2 19 (*)    Glucose, Bld 158 (*)    BUN 58 (*)    Creatinine, Ser 1.55 (*)    AST 64 (*)    ALT 72 (*)    Total Bilirubin 1.3 (*)    GFR calc non Af Amer 27 (*)    GFR calc Af Amer 31 (*)    All other components within normal limits  URINALYSIS, ROUTINE W REFLEX  MICROSCOPIC - Abnormal; Notable for the following components:   Color, Urine AMBER (*)    APPearance HAZY (*)    Ketones, ur 5 (*)    Protein, ur 100 (*)    Bacteria, UA MANY (*)    Squamous Epithelial / LPF 0-5 (*)    All other components within normal limits  I-STAT CG4 LACTIC ACID, ED - Abnormal; Notable for the following components:   Lactic Acid, Venous 4.13 (*)    All other components within normal limits  I-STAT CG4 LACTIC ACID, ED - Abnormal; Notable for the following components:   Lactic Acid, Venous 3.79 (*)    All other components within normal limits  I-STAT CG4 LACTIC ACID, ED - Abnormal; Notable  for the following components:   Lactic Acid, Venous 4.62 (*)    All other components within normal limits  URINE CULTURE  CULTURE, BLOOD (ROUTINE X 2)  CULTURE, BLOOD (ROUTINE X 2)  TROPONIN I  TSH  I-STAT CG4 LACTIC ACID, ED    EKG  EKG Interpretation  Date/Time:  Thursday October 13 2017 12:25:06 EST Ventricular Rate:  123 PR Interval:    QRS Duration: 135 QT Interval:  345 QTC Calculation: 504 R Axis:   25 Text Interpretation:  Atrial fibrillation Right bundle branch block Repol abnrm suggests ischemia, diffuse leads No previous ECGs available Confirmed by Alvira Monday ([id number]) on 10/13/2017 2:33:36 PM       Radiology Ct Head Wo Contrast  Result Date: 10/13/2017 CLINICAL DATA:  Mental status changes. EXAM: CT HEAD WITHOUT CONTRAST TECHNIQUE: Contiguous axial images were obtained from the base of the skull through the vertex without intravenous contrast. COMPARISON:  07/21/2012 FINDINGS: Brain: Moderate low density in the periventricular white matter likely related to small vessel disease. Cortically based infarct involving the posterior right MCA distribution, including on image 18/series 2. Sulci effacement. No midline shift. Expected cerebral and cerebellar volume loss. No hydrocephalus, hemorrhage, intra-axial, or extra-axial fluid collection. Vascular:  Intracranial atherosclerosis. Skull: Normal Sinuses/Orbits: Normal imaged portions of the orbits and globes. Clear paranasal sinuses and mastoid air cells. Other: None. IMPRESSION: 1. Right posterior middle cerebral artery distribution cortically based infarct, favored to be subacute. Sulci effacement but no significant midline shift. 2.  Cerebral atrophy and small vessel ischemic change. These results were called by telephone at the time of interpretation on 10/13/2017 at 3:53 pm to Dr. Alvira Monday , who verbally acknowledged these results. Electronically Signed   By: Jeronimo Greaves M.D.   On: 10/13/2017 15:55    Procedures .Critical Care Performed by: Alvira Monday, MD Authorized by: Alvira Monday, MD   Comments:     CRITICAL CARE: CVA, transfer Performed by: Lynnea Ferrier   Total critical care time: 40 minutes  Critical care time was exclusive of separately billable procedures and treating other patients.  Critical care was necessary to treat or prevent imminent or life-threatening deterioration.  Critical care was time spent personally by me on the following activities: development of treatment plan with patient and/or surrogate as well as nursing, discussions with consultants, evaluation of patient's response to treatment, examination of patient, obtaining history from patient or surrogate, ordering and performing treatments and interventions, ordering and review of laboratory studies, ordering and review of radiographic studies, pulse oximetry and re-evaluation of patient's condition.    (including critical care time)  Medications Ordered in ED Medications  cefTRIAXone (ROCEPHIN) 2 g in dextrose 5 % 50 mL IVPB (0 g Intravenous Stopped 10/13/17 1510)  sodium chloride 0.9 % bolus 1,000 mL (0 mLs Intravenous Stopped 10/13/17 1308)    And  sodium chloride 0.9 % bolus 1,000 mL (0 mLs Intravenous Stopped 10/13/17 1339)    And  sodium chloride 0.9 % bolus 250 mL (0 mLs  Intravenous Stopped 10/13/17 1534)  LORazepam (ATIVAN) injection 0.5 mg (0.5 mg Intravenous Given 10/13/17 1441)     Initial Impression / Assessment and Plan / ED Course  I have reviewed the triage vital signs and the nursing notes.  Pertinent labs & imaging results that were available during my care of the patient were reviewed by me and considered in my medical decision making (see chart for details).    83 year old female with a history of coronary artery  disease, hypertension, hypothyroidism presents with concern for altered mental status.  Patient in atrial fibrillation with RVR on arrival to the emergency department with a rate in the 130s, without any history of atrial fibrillation.  Her lactic acid is elevated above 4.  Unclear initially if vital sign abnormalities secondary to severe dehydration, given history of patient not eating or drinking, or possible sepsis.  Given urinalysis shows many bacteria, will cover for possible infection with Rocephin, and patient was given fluids per sepsis protocol.  Heart rate improving with fluids to the low 100s, and given suspect other acute pathology as etiology of RVR with improvement with fluids/possible infection/dehydration as underlying cause, will not pursue further rate control agents at this time.    Given concern for possible left-sided neglect on exam, CT head was done which confirmed large right-sided MCA CVA.  Unclear last known normal.  Discussed with neurology, Dr. Laurence SlateAroor who came to bedside to evaluate the patient.  She will be transferred to [facility] for further care.  Discussed with son, Lorin PicketScott, who is POA and [place] from [place] and arriving tomorrow. Agree that patient is DNR, however he will discuss intubation and other interventions with other family members.  Patient awaiting [facility].   Final Clinical Impressions(s) / ED Diagnoses   Final diagnoses:  Cerebrovascular accident (CVA), unspecified  mechanism (HCC)  Atrial fibrillation with RVR (HCC)  Lactic acidosis  Urinary tract infection without hematuria, site unspecified  Acute kidney injury [facility](HCC)  Dehydration    ED Discharge Orders    None       Alvira MondaySchlossman, Shimeka Bacot, MD 10/13/17 1943

## 2017-10-13 NOTE — Clinical Social Work Note (Signed)
Clinical Social Work Assessment  Patient Details  Name: Jennifer Zavala MRN: [id number] Date of Birth: July 24, 1916  Date of referral:  10/13/17               Reason for consult:  Facility Placement                Permission sought to share information with:  Facility Industrial/product designerContact Representative Permission granted to share information::  No  Name::        Agency::     Relationship::     Contact Information:     Housing/Transportation Living arrangements for the past 2 months:    Source of Information:  Adult Children Patient Interpreter Needed:  None Criminal Activity/Legal Involvement Pertinent to Current Situation/Hospitalization:    Significant Relationships:  Adult Children Lives with:  Facility Resident Do you feel safe going back to the place where you live?  No Need for family participation in patient care:  Yes (Comment)  Care giving concerns:   CSW spoke to the pt's son Jennifer Z[place] who is from [place] at ph: [phone].  Pt's son and his wife [place] driving [place] tonight and expect to arrive tomorrow (10/14/17) "some time in the late afternoon".    Pt's son believes the pt "doesn't have long" due to questions by the EDP about pt's Advanced Directives.  CSW stated he was unfamiliar with the pt's medical condtion, nut wanted to seek collateral information for the CSW on the pt's medical floor.    Pt's on Jennifer PunScott Zavala stated pt, although 83 years old was "very independent" until now so pt's sudden decompensation was a surprise to him.  Per pt's son pt was at [facility] in the Independent Living housing.  Pt's son was packing to leave and could not provide much more information at this time. Pt's son stated he would like to be consulted on all medical or social work decisions at this time.    Social Worker assessment / plan:  CSW attempted to meet with pt but pt was not A&O at that time and pt spoke to son. Son is not fully aware of pt's  medical condition at this time and has no plan yet for the pt but hopes to know more on 11/30 about appropriate plan of of care for the pt.  CSW unable to formulate plan without son knowing more and CSW did not discuss possible SNF/Hospice placement without more knowledge and did not want to give pt's family false hope or inappropriate suggestion(s).  Employment status:  Retired Engineer, miningnsurance information:  Medicare, Managed Care PT Recommendations:  Not assessed at this time Information / Referral to community resources:     Patient/Family's Response to care:  Patient not alert and oriented.  Patient's family unable to formulate plan at this time.  Pt's son and DIL supportive and strongly involved in pt.'s care.  Pt.'s *son pleasant and appreciated CSW intervention.    Patient/Family's Understanding of and Emotional Response to Diagnosis, Current Treatment, and Prognosis:  Still assessing, family is unsure even after speaking to the EDP.   Emotional Assessment Appearance:  Appears stated age Attitude/Demeanor/Rapport:    Affect (typically observed):    Orientation:  Fluctuating Orientation (Suspected and/or reported Sundowners) Alcohol / Substance use:    Psych involvement (Current and /or in the community):     Discharge Needs  Concerns to be addressed:    Readmission within the last 30 days:  No Current discharge  risk:  None Barriers to Discharge:  No Barriers Identified   Mercy RidingJonathan F Chetara Zavala, LCSWA 10/13/2017, 7:15 PM

## 2017-10-13 NOTE — ED Notes (Signed)
Writer notified EDP Schlossman of abnormal I stat lactic result

## 2017-10-13 NOTE — Consult Note (Signed)
Neurology Consultation  Reason for Consult: Stroke on CT Referring Physician: Dr. Dalene SeltzerSchlossman  CC: Generalized weakness, poor appetite  History is obtained from -chart  HPI: Jennifer Zavala is a 83 y.o. female who has a past medical history of coronary artery disease, hypertension and hypothyroidism, who was brought in from her facility for failure to thrive. She noted on exam to be neglecting her left side and hence a CT scan of the head was obtained that showed a subacute right MCA posterior division stroke. Neurology consult was obtained for the stroke. There is no family member at bedside at this time.  She has a son [place] who the ER is trying to reach at this time. Her last known well was at least a few days ago, possibly Sunday.  She had refused to go to church on Sunday which she regularly did with 1 of the other family members.  She was not feeling well since then.  They were not sure what is going on with her.  They went to check on her again today and she was very disoriented and that is what prompted the hospital visit.  LKW: Sunday, October 09, 2017 tpa given?: no, outside TPA window Premorbid modified Rankin scale (mRS): Unable to assess from chart, no family at bedside    ROS:  Unable to obtain due to altered mental status.   Past Medical History:  Diagnosis Date  . Angina at rest [facility](HCC)   . Aortic aneurysm (HCC)   . Coronary artery disease   . Hypertension   . Hypothyroid     History reviewed. No pertinent family history.   Social History:   reports that she has quit smoking. she has never used smokeless tobacco. She reports that she does not drink alcohol or use drugs. Medications No current facility-administered medications for this encounter.   Current Outpatient Medications:  .  aspirin EC 81 MG tablet, Take 81 mg by mouth daily., Disp: , Rfl:  .  atenolol (TENORMIN) 50 MG tablet, Take 50 mg by mouth daily., Disp: , Rfl:  .   cholecalciferol (VITAMIN D) 1000 UNITS tablet, Take 1,000 Units by mouth daily., Disp: , Rfl:  .  Multiple Vitamin (MULTIVITAMIN WITH MINERALS) TABS, Take 1 tablet by mouth daily., Disp: , Rfl:  .  nitroGLYCERIN (NITROSTAT) 0.4 MG SL tablet, Place 0.4 mg under the tongue every 5 (five) minutes as needed. For chest pain, Disp: , Rfl:  .  olmesartan (BENICAR) 40 MG tablet, Take 20 mg by mouth daily. , Disp: , Rfl:  .  Omega 3-6-9 Fatty Acids LIQD, Take 30 mLs by mouth daily., Disp: , Rfl:  .  simvastatin (ZOCOR) 40 MG tablet, Take 40 mg by mouth every evening., Disp: , Rfl:    Exam: Current vital signs: BP (!) 94/48   Pulse 68   Temp (!) 96.7 F (35.9 C) (Rectal)   Resp 20   SpO2 (!) 79%  Vital signs in last 24 hours: Temp:  [96.7 F (35.9 C)] 96.7 F (35.9 C) (11/29 1255) Pulse Rate:  [68-86] 68 (11/29 1430) Resp:  [20-32] 20 (11/29 1430) BP: (94-123)/(48-97) 94/48 (11/29 1430) SpO2:  [79 %-99 %] 79 % (11/29 1430)  GENERAL: Awake, alert in NAD HEENT: - Normocephalic and atraumatic, dry mm, no LN++, no Thyromegally LUNGS - Clear to auscultation bilaterally with no wheezes CV - S1S2 RRR, no m/r/g, equal pulses bilaterally. ABDOMEN - Soft, nontender, nondistended with normoactive BS Ext: warm,  well perfused, intact peripheral pulses, no edema  NEURO:  Mental Status: Awake, alert, oriented to self and hospital.  Oriented to city but not state, country or date. Language: speech is mildly dysarthric poor attention concentration.  Mimics commands.  Follows some simple commands and midline. Cranial Nerves: PERRL right gaze preference but able to overcome to look to the left,, visual fields examination shows possible left hemianopsia but difficult to assess with inattention, possible left nasolabial fold flattening, hearing grossly reduced. Motor: Moving all 4 extremities antigravity.  Nearly symmetric 5/5 strength in the right upper, left lower and right lower extremity.  4/5 in the left  upper extremity. Tone: is normal and bulk is normal Sensation-neglects the left side to noxious stimuli. Coordination: Unable to assess Gait- deferred  NIHSS 1a Level of Conscious.: 0 1b LOC Questions: 2 1c LOC Commands: 1 2 Best Gaze: 1 3 Visual: 1 4 Facial Palsy: 1 5a Motor Arm - left: 1 5b Motor Arm - Right: 0 6a Motor Leg - Left: 0 6b Motor Leg - Right: 0 7 Limb Ataxia: 0 8 Sensory: 2 9 Best Language: 1 10 Dysarthria: 1 11 Extinct. and Inatten.: 2 TOTAL: 13  labs I have reviewed labs in epic and the results pertinent to this consultation are:  CBC    Component Value Date/Time   WBC 8.8 10/13/2017 1243   RBC 3.78 (L) 10/13/2017 1243   HGB 12.0 10/13/2017 1243   HCT 36.8 10/13/2017 1243   PLT 289 10/13/2017 1243   MCV 97.4 10/13/2017 1243   MCH 31.7 10/13/2017 1243   MCHC 32.6 10/13/2017 1243   RDW 16.5 (H) 10/13/2017 1243   LYMPHSABS 1.0 10/13/2017 1243   MONOABS 0.4 10/13/2017 1243   EOSABS 0.0 10/13/2017 1243   BASOSABS 0.0 10/13/2017 1243    CMP     Component Value Date/Time   NA 139 10/13/2017 1243   K 4.6 10/13/2017 1243   CL 106 10/13/2017 1243   CO2 19 (L) 10/13/2017 1243   GLUCOSE 158 (H) 10/13/2017 1243   BUN 58 (H) 10/13/2017 1243   CREATININE 1.55 (H) 10/13/2017 1243   CALCIUM 9.0 10/13/2017 1243   PROT 7.7 10/13/2017 1243   ALBUMIN 4.0 10/13/2017 1243   AST 64 (H) 10/13/2017 1243   ALT 72 (H) 10/13/2017 1243   ALKPHOS 111 10/13/2017 1243   BILITOT 1.3 (H) 10/13/2017 1243   GFRNONAA 27 (L) 10/13/2017 1243   GFRAA 31 (L) 10/13/2017 1243    Imaging I have reviewed the images obtained: CT-scan of the brain  -shows a subacute right MCA territory stroke.  No signs of herniation.  No bleed.   Assessment:  83 year old woman with a past medical history of coronary artery disease, hypertension and hypothyroidism brought in for concerns of failure to thrive and confusion. Noncontrast CT of the head shows a subacute right MCA territory large  stroke. She on her exam is exhibiting neglect of the left side and some left hemiparesis along with possible left visual field cut. [place] son [place] in [place] and is the medical power of attorney.  The ER is trying to get touch with the son at this time.  Per ER attending conversation, the family has decided to make her DNR but further conversations have not yet happened.   Impression: Right MCA territory stroke -subacute-most likely embolic  Recommendations: -Ad[facility] to hospitalist at [facility] -Telemetry monitoring -Allow for permissive hypertension for the first 24-48h - only treat PRN if SBP >220 mmHg.  Blood pressures can be gradually normalized to SBP<140 upon discharge. -MRI, MRA  of the brain without contrast -Echocardiogram -HgbA1c, fasting lipid panel -Frequent neuro checks -Prophylactic therapy-Antiplatelet med: Aspirin - dose 325mg  PO or 300mg  PR -Risk factor modification -PT consult, OT consult, Speech consult  Please page stroke NP/PA/MD (listed on AMION)  from 8am-4 pm as this patient will be followed by the stroke team at this point.  Milon Dikes, MD [facility] [phone] If 7pm to 7am, please call on call as listed on AMION.

## 2017-10-13 NOTE — ED Triage Notes (Signed)
Pt arrived [facility] with c/o failure to thrive per staff pt has been declining for the past 3 weeks. Pt is refusing to eat, attend church, and get up from the bed. Pt is disoriented x4 at this time which is a deviation from pt baseline per staff and family.

## 2017-10-13 NOTE — Progress Notes (Signed)
Consult request has been received. CSW attempting to follow up at present time.  CSW spoke to the pt's son Jennifer Zavala who is from [place] at ph: [phone].  Pt's son and his wife are [place] [facility] tonight and expect to arrive tomorrow (10/14/17) "some time in the late afternoon".    Pt's son believes the pt "doesn't have long" due to questions by the EDP about pt's Advanced Directives.  CSW stated he was unfamiliar with the pt's medical condtion, nut wanted to seek collateral information for the CSW on the pt's medical floor.    Pt's on Jennifer Zavala stated pt, although 83 years old was "very independent" until now so pt's sudden decompensation was a surprise to him.  Per pt's son [facility] in [facility] Living housing.  Pt's son was packing to leave and could not provide much more information at this time.  [facility] CSW completed assessment with the pt. See assessment.  Please reconsult if future social work needs arise.  CSW signing off, as social work intervention is no longer needed.  Jennifer PeaJonathan F. Arlean Thies, LCSW, LCAS, CSI Clinical Social Worker Ph: [phone]

## 2017-10-14 ENCOUNTER — Inpatient Hospital Stay (HOSPITAL_COMMUNITY): Payer: Medicare Other

## 2017-10-14 DIAGNOSIS — I4891 Unspecified atrial fibrillation: Secondary | ICD-10-CM

## 2017-10-14 DIAGNOSIS — Z515 Encounter for palliative care: Secondary | ICD-10-CM

## 2017-10-14 DIAGNOSIS — E860 Dehydration: Secondary | ICD-10-CM

## 2017-10-14 LAB — LIPID PANEL
Cholesterol: 145 mg/dL (ref 0–200)
HDL: 34 mg/dL — AB (ref >40)
LDL CALC: 88 mg/dL (ref 0–99)
Total CHOL/HDL Ratio: 4.3 RATIO
Triglycerides: 116 mg/dL (ref <150)
VLDL: 23 mg/dL (ref 0–40)

## 2017-10-14 LAB — LACTIC ACID, PLASMA: Lactic Acid, Venous: 3.3 mmol/L (ref 0.5–1.9)

## 2017-10-14 LAB — HEMOGLOBIN A1C
Hgb A1c MFr Bld: 5.8 % — ABNORMAL HIGH (ref 4.8–5.6)
Mean Plasma Glucose: 119.76 mg/dL

## 2017-10-14 LAB — CBC
HCT: 36.6 % (ref 36.0–46.0)
Hemoglobin: 11.5 g/dL — ABNORMAL LOW (ref 12.0–15.0)
MCH: 31 pg (ref 26.0–34.0)
MCHC: 31.4 g/dL (ref 30.0–36.0)
MCV: 98.7 fL (ref 78.0–100.0)
PLATELETS: 232 10*3/uL (ref 150–400)
RBC: 3.71 MIL/uL — ABNORMAL LOW (ref 3.87–5.11)
RDW: 16.9 % — AB (ref 11.5–15.5)
WBC: 12.9 10*3/uL — ABNORMAL HIGH (ref 4.0–10.5)

## 2017-10-14 LAB — BASIC METABOLIC PANEL
Anion gap: 12 (ref 5–15)
BUN: 53 mg/dL — AB (ref 6–20)
CALCIUM: 8.3 mg/dL — AB (ref 8.9–10.3)
CO2: 16 mmol/L — ABNORMAL LOW (ref 22–32)
Chloride: 113 mmol/L — ABNORMAL HIGH (ref 101–111)
Creatinine, Ser: 1.58 mg/dL — ABNORMAL HIGH (ref 0.44–1.00)
GFR calc Af Amer: 30 mL/min — ABNORMAL LOW (ref >60)
GFR, EST NON AFRICAN AMERICAN: 26 mL/min — AB (ref >60)
GLUCOSE: 122 mg/dL — AB (ref 65–99)
Potassium: 4.8 mmol/L (ref 3.5–5.1)
Sodium: 141 mmol/L (ref 135–145)

## 2017-10-14 LAB — MRSA PCR SCREENING: MRSA by PCR: NEGATIVE

## 2017-10-14 MED ORDER — ASPIRIN 300 MG RE SUPP
300.0000 mg | Freq: Every day | RECTAL | Status: DC
  Administered 2017-10-14: 300 mg via RECTAL
  Filled 2017-10-14: qty 1

## 2017-10-14 MED ORDER — METOPROLOL TARTRATE 5 MG/5ML IV SOLN
2.5000 mg | Freq: Four times a day (QID) | INTRAVENOUS | Status: DC | PRN
  Administered 2017-10-14 – 2017-10-15 (×3): 2.5 mg via INTRAVENOUS
  Filled 2017-10-14 (×4): qty 5

## 2017-10-14 MED ORDER — METOPROLOL TARTRATE 5 MG/5ML IV SOLN
2.5000 mg | Freq: Once | INTRAVENOUS | Status: AC
  Administered 2017-10-14: 2.5 mg via INTRAVENOUS

## 2017-10-14 MED ORDER — DEXTROSE 5 % IV SOLN
2.0000 g | Freq: Once | INTRAVENOUS | Status: AC
  Administered 2017-10-14: 2 g via INTRAVENOUS
  Filled 2017-10-14: qty 2

## 2017-10-14 MED ORDER — METOPROLOL TARTRATE 5 MG/5ML IV SOLN
2.5000 mg | Freq: Three times a day (TID) | INTRAVENOUS | Status: DC
  Administered 2017-10-14 (×2): 2.5 mg via INTRAVENOUS
  Filled 2017-10-14 (×3): qty 5

## 2017-10-14 MED ORDER — MORPHINE SULFATE (PF) 2 MG/ML IV SOLN
2.0000 mg | INTRAVENOUS | Status: DC | PRN
  Administered 2017-10-14 – 2017-10-15 (×5): 2 mg via INTRAVENOUS
  Filled 2017-10-14 (×6): qty 1

## 2017-10-14 MED ORDER — ACETAMINOPHEN 160 MG/5ML PO SOLN: 650.0000 mg | ORAL | Status: DC | PRN

## 2017-10-14 MED ORDER — DEXTROSE 5 % IV SOLN: 2.0000 g | INTRAVENOUS | Status: DC

## 2017-10-14 MED ORDER — ACETAMINOPHEN 650 MG RE SUPP
650.0000 mg | RECTAL | Status: DC | PRN
  Administered 2017-10-14: 650 mg via RECTAL
  Filled 2017-10-14: qty 1

## 2017-10-14 MED ORDER — SODIUM CHLORIDE 0.9 % IV SOLN
INTRAVENOUS | Status: DC
  Administered 2017-10-14: 02:00:00 via INTRAVENOUS

## 2017-10-14 MED ORDER — ENSURE ENLIVE PO LIQD: 237.0000 mL | Freq: Two times a day (BID) | ORAL | Status: DC | PRN

## 2017-10-14 MED ORDER — HALOPERIDOL 0.5 MG PO TABS
0.5000 mg | ORAL_TABLET | Freq: Once | ORAL | Status: DC | PRN
Start: 2017-10-14 — End: 2017-10-14
  Filled 2017-10-14: qty 1

## 2017-10-14 MED ORDER — STROKE: EARLY STAGES OF RECOVERY BOOK
Freq: Once | Status: AC
  Administered 2017-10-14: 02:00:00
  Filled 2017-10-14: qty 1

## 2017-10-14 MED ORDER — LORAZEPAM 2 MG/ML IJ SOLN: 1.0000 mg | INTRAMUSCULAR | Status: DC

## 2017-10-14 MED ORDER — ASPIRIN EC 81 MG PO TBEC: 81.0000 mg | DELAYED_RELEASE_TABLET | Freq: Every day | ORAL | Status: DC

## 2017-10-14 MED ORDER — LEVOTHYROXINE SODIUM 100 MCG IV SOLR
25.0000 ug | Freq: Every day | INTRAVENOUS | Status: DC
  Administered 2017-10-14 – 2017-10-18 (×5): 25 ug via INTRAVENOUS
  Filled 2017-10-14 (×6): qty 5

## 2017-10-14 MED ORDER — METOPROLOL TARTRATE 25 MG PO TABS
25.0000 mg | ORAL_TABLET | Freq: Two times a day (BID) | ORAL | Status: DC
  Administered 2017-10-14 – 2017-10-18 (×5): 25 mg via ORAL
  Filled 2017-10-14 (×6): qty 1

## 2017-10-14 MED ORDER — PANTOPRAZOLE SODIUM 40 MG IV SOLR
40.0000 mg | INTRAVENOUS | Status: DC
  Administered 2017-10-14 – 2017-10-18 (×5): 40 mg via INTRAVENOUS
  Filled 2017-10-14 (×4): qty 40

## 2017-10-14 MED ORDER — ACETAMINOPHEN 325 MG PO TABS: 650.0000 mg | ORAL_TABLET | ORAL | Status: DC | PRN

## 2017-10-14 MED ORDER — DILTIAZEM HCL 30 MG PO TABS
30.0000 mg | ORAL_TABLET | Freq: Three times a day (TID) | ORAL | Status: DC
  Filled 2017-10-14: qty 1

## 2017-10-14 MED ORDER — SENNOSIDES-DOCUSATE SODIUM 8.6-50 MG PO TABS: 1.0000 | ORAL_TABLET | Freq: Every evening | ORAL | Status: DC | PRN

## 2017-10-14 NOTE — Evaluation (Signed)
Occupational Therapy Evaluation Patient Details Name: Jennifer Zavala MRN: [id number] DOB: 11/30/1915 Today's Date: 10/14/2017    History of Present Illness 83 yo female admitted from independent living with CT (+) R MCA pending MRI and UTI.    Clinical Impression   PT admitted with R MCA and UTI (+). Pt currently with functional limitiations due to the deficits listed below (see OT problem list). PTA pt was independent with adls and living at independent living per chart review. Son was not present at the time of evaluation. Pt with pending MRI. Pt unable to follow simple commands, fixated on bil mittens over hands, and expressing communication but not following any receptive communication.  Pt will benefit from skilled OT to increase their independence and safety with adls and balance to allow discharge CIR. Pt able to static stand with (A) during session. Assistance for d/c will need to be determined for further d/c planning.      Follow Up Recommendations  CIR;Supervision/Assistance - 24 hour    Equipment Recommendations  Other (comment)(TBA)    Recommendations for Other Services Rehab consult     Precautions / Restrictions Precautions Precautions: Fall Precaution Comments: bil mittens Restrictions Weight Bearing Restrictions: No      Mobility Bed Mobility Overal bed mobility: Needs Assistance Bed Mobility: Rolling;Supine to Sit;Sit to Supine Rolling: Total assist   Supine to sit: Max assist;+2 for physical assistance Sit to supine: +2 for physical assistance;Min assist   General bed mobility comments: Pt initiates return to supine prior to therapist cueing and internally motivated to complete task. BP stable and checked prior. Pt moving R UE at times during reading due to restless behavior  Transfers Overall transfer level: Needs assistance Equipment used: 2 person hand held assist Transfers: Sit to/from Stand           General transfer comment: pt able to  static standing with bil le braced against bed and kyphotic posture    Balance Overall balance assessment: Needs assistance Sitting-balance support: No upper extremity supported;Feet supported Sitting balance-Leahy Scale: Zero     Standing balance support: No upper extremity supported;During functional activity Standing balance-Leahy Scale: Poor                             ADL either performed or assessed with clinical judgement   ADL Overall ADL's : Needs assistance/impaired Eating/Feeding: Total assistance Eating/Feeding Details (indicate cue type and reason): pt eyes closed not visually attending. holding apple sauce in mouth and some residual present after swallow Grooming: Wash/dry face;Sitting;Moderate assistance Grooming Details (indicate cue type and reason): pt requires total (A) due to L lean for balance. pt hand over hand to initiate task and completes task with continued input required              Lower Body Dressing: Total assistance Lower Body Dressing Details (indicate cue type and reason): don socks in supine   Toilet Transfer Details (indicate cue type and reason): incontinent of bladder           General ADL Comments: pt supine to sit with incr arousal and becoming restless. pt provided x3 spoons of medication from RN with decr swallow. Question attention to task affecting the task. pt attempting to scoot buttock backward verbalized "goodbye" at the same time. pt does not respond to any questions provided my therapist. pt only expressing communication.      Vision   Vision Assessment?: (difficult to assess  due to cognition and follow commnads) Additional Comments: pt with eyes closed tight throughout session. pt does open L eye slightly with warm water of wash cloth.      Perception     Praxis      Pertinent Vitals/Pain Pain Assessment: No/denies pain     Hand Dominance (unknown but appears R handed )   Extremity/Trunk Assessment  Upper Extremity Assessment Upper Extremity Assessment: Generalized weakness(pt moving bil Ue in a shaking matter attempting to doff mitt)   Lower Extremity Assessment Lower Extremity Assessment: Generalized weakness(able to static stand with bracing against bed)   Cervical / Trunk Assessment Cervical / Trunk Assessment: Kyphotic   Communication Communication Communication: Receptive difficulties   Cognition Arousal/Alertness: Lethargic Behavior During Therapy: Restless Overall Cognitive Status: Difficult to assess                                 General Comments: Pt attempting to remove mittens with arousal. pt states "take these gloves off" pt does not respond to name at this time and does not verbalize name when asked "what is your name" pt repeating the same phrase to all tactile and auditory input.    General Comments       Exercises     Shoulder Instructions      Home Living Family/patient expects to be discharged to:: Unsure                                        Prior Functioning/Environment Level of Independence: Independent                 OT Problem List: Decreased strength;Impaired balance (sitting and/or standing);Decreased activity tolerance;Decreased cognition;Decreased safety awareness;Decreased knowledge of use of DME or AE;Decreased knowledge of precautions      OT Treatment/Interventions: Self-care/ADL training;Therapeutic exercise;Neuromuscular education;DME and/or AE instruction;Therapeutic activities;Cognitive remediation/compensation;Visual/perceptual remediation/compensation;Patient/family education;Balance training    OT Goals(Current goals can be found in the care plan section) Acute Rehab OT Goals Patient Stated Goal: none stated at this time OT Goal Formulation: Patient unable to participate in goal setting Time For Goal Achievement: 10/28/17 Potential to Achieve Goals: Good ADL Goals Pt Will Perform Eating:  with min assist;sitting Pt Will Perform Grooming: with min assist;sitting Pt Will Perform Upper Body Bathing: with min assist;sitting Pt Will Transfer to Toilet: with +2 assist;with min assist;bedside commode;stand pivot transfer Additional ADL Goal #1: Pt will complete bed mobility supine<>sit at mod (A) level as precursor to adls  OT Frequency: Min 2X/week   Barriers to D/C:            Co-evaluation              AM-PAC PT "6 Clicks" Daily Activity     Outcome Measure Help from another person eating meals?: Total Help from another person taking care of personal grooming?: Total Help from another person toileting, which includes using toliet, bedpan, or urinal?: Total Help from another person bathing (including washing, rinsing, drying)?: Total Help from another person to put on and taking off regular upper body clothing?: Total Help from another person to put on and taking off regular lower body clothing?: Total 6 Click Score: 6   End of Session Equipment Utilized During Treatment: Oxygen Nurse Communication: Mobility status;Precautions  Activity Tolerance: Patient tolerated treatment well Patient left: in bed;with call bell/phone  within reach;with bed alarm set;with nursing/sitter in room  OT Visit Diagnosis: Unsteadiness on feet (R26.81);Muscle weakness (generalized) (M62.81);Other symptoms and signs involving cognitive function                Time: [id number] OT Time Calculation (min): 22 min Charges:  OT General Charges $OT Visit: 1 Visit OT Evaluation $OT Eval Moderate Complexity: 1 Mod G-Codes:      Mateo FlowJones, Brynn   OTR/L Pager: [phone] Office: [phone] .   Boone MasterJones, Tiny Rietz B 10/14/2017, 9:48 AM

## 2017-10-14 NOTE — Progress Notes (Signed)
STROKE TEAM PROGRESS NOTE  Admission History: Jennifer Zavala is a 83 y.o. female who has a past medical history of coronary artery disease, hypertension and hypothyroidism, who was brought in from her facility for failure to thrive. She noted on exam to be neglecting her left side and hence a CT scan of the head was obtained that showed a subacute right MCA posterior division stroke. Neurology consult was obtained for the stroke. There is no family member at bedside at this time.  She has a son who [place] in [place] who the ER is trying to reach at this time. Her last known well was at least a few days ago, possibly Sunday.  She had refused to go to church on Sunday which she regularly did with 1 of the other family members.  She was not feeling well since then.  They were not sure what is going on with her.  They went to check on her again today and she was very disoriented and that is what prompted the hospital visit.  LKW: Sunday, October 09, 2017 tpa given?: no, outside TPA window Premorbid modified Rankin scale (mRS): Unable to assess from chart, no family at bedside  SUBJECTIVE (INTERVAL HISTORY) Patient is lying in bed. Can be aroused to follow simple midline commands.No family at bedside   OBJECTIVE Lab Results: CBC:  Recent Labs  Lab 10/13/17 1243 10/14/17 0226  WBC 8.8 12.9*  HGB 12.0 11.5*  HCT 36.8 36.6  MCV 97.4 98.7  PLT 289 232   BMP: Recent Labs  Lab 10/13/17 1243 10/14/17 0226  NA 139 141  K 4.6 4.8  CL 106 113*  CO2 19* 16*  GLUCOSE 158* 122*  BUN 58* 53*  CREATININE 1.55* 1.58*  CALCIUM 9.0 8.3*   Liver Function Tests:  Recent Labs  Lab 10/13/17 1243  AST 64*  ALT 72*  ALKPHOS 111  BILITOT 1.3*  PROT 7.7  ALBUMIN 4.0   Thyroid Function Studies:  Recent Labs    10/13/17 2037  TSH 2.146   Urinalysis:  Recent Labs  Lab 10/13/17 1245  COLORURINE AMBER*  APPEARANCEUR HAZY*  LABSPEC 1.018  PHURINE 5.0  GLUCOSEU  NEGATIVE  HGBUR NEGATIVE  BILIRUBINUR NEGATIVE  KETONESUR 5*  PROTEINUR 100*  NITRITE NEGATIVE  LEUKOCYTESUR NEGATIVE    PHYSICAL EXAM Temp:  [96.7 F (35.9 C)-97.7 F (36.5 C)] 97.5 F (36.4 C) (11/30 0710) Pulse Rate:  [25-168] 99 (11/30 0607) Resp:  [16-32] 16 (11/30 1000) BP: (94-151)/(48-123) 113/69 (11/30 1000) SpO2:  [71 %-100 %] 90 % (11/30 0909) Weight:  [63 kg (139 lb)] 63 kg (139 lb) (11/30 0117) General - Well nourished, well developed, in no apparent distress Respiratory - Lungs clear bilaterally. No wheezing. Cardiovascular - Regular rate and rhythm   NEURO:  Mental Status: Awake, alert, oriented to self and hospital.  Oriented to city but not state, country or date. Language: speech is mildly dysarthric poor attention concentration.  Mimics commands.  Follows some simple commands and midline. Cranial Nerves: PERRL right gaze preference but able to overcome to look to the left,, visual fields examination shows possible left hemianopsia but difficult to assess with inattention, possible left nasolabial fold flattening, hearing grossly reduced. Motor: Moving all 4 extremities antigravity.  Nearly symmetric 5/5 strength in the right upper, left lower and right lower extremity.  4/5 in the left upper extremity. Tone: is normal and bulk is normal Sensation-neglects the left side to noxious stimuli. Coordination: Unable to assess  Gait- deferred  IMAGING: I have personally reviewed the radiological images below and agree with the radiology interpretations. Ct Head Wo Contrast Result Date: 10/13/2017 IMPRESSION: 1. Right posterior middle cerebral artery distribution cortically based infarct, favored to be subacute. Sulci effacement but no significant midline shift. 2.  Cerebral atrophy and small vessel ischemic change. These results were called by telephone at the time of interpretation on 10/13/2017 at 3:53 pm to Dr. Alvira MondayERIN SCHLOSSMAN , who verbally acknowledged these  results. Electronically Signed   By: Jeronimo GreavesKyle  Talbot M.D.   On: 10/13/2017 15:55   MRI/MRA                                                             PENDING Echocardiogram:                                               PENDING B/L Carotid U/S:                                                PENDING _____________________________________________________________________ ASSESSMENT: Ms. Jennifer Zavala is a 83 y.o. female with PMH of HTN, HLD, Hypothyroidism admitted for confusion and failure to thrive. Noncontrast CT of the head shows a subacute right MCA territory large stroke. She on her exam is exhibiting neglect of the left side and some left hemiparesis along with possible left visual field cut. She is a partial code and Palliative Care has been consulted today. Son lives in [place] and is coming in today to make decisions regarding care.   RIGHT MCA posterior division STROKE:  Suspected Etiology: most likely embolic from atrial fibrillation Resultant Symptoms: possible left hemianopsia, right gaze preference, mild dysarthria, Left sided weakness` Stroke Risk Factors: hyperlipidemia, hypertension and Aortic aneurysm, Hypothyroidism Other Stroke Risk Factors: Advanced age, Coronary artery disease  Outstanding Stroke Work-up Studies: MRI/MRA                                                               PENDING Echocardiogram:                                                   PENDING B/L Carotid U/S:                                                     PENDING  PLAN  10/14/2017: Continue Aspirin/ Statin Ongoing aggressive stroke risk factor management Patient's family will be counseled to be compliant with her antithrombotic medications Follow Imaging and studies  DYSPHAGIA: NPO until passes SLP swallow evaluation  UTI: ABX - Rocephin, per Medicine team  HYPERTENSION: Stable Permissive hypertension (OK if <220/120) for 24-48 hours post stroke and then gradually normalized within  5-7 days.  Long term BP goal normotensive. May slowly restart home B/P medications after 48 hours Home Meds: Atenolol  HYPERLIPIDEMIA:    Component Value Date/Time   CHOL 145 10/14/2017 0226   TRIG 116 10/14/2017 0226   HDL 34 (L) 10/14/2017 0226   CHOLHDL 4.3 10/14/2017 0226   VLDL 23 10/14/2017 0226   LDLCALC 88 10/14/2017 0226  Home Meds:  Pravastatin 40 mg LDL  goal < 70 Continued Pravastatin 40 mg daily, if appropriate Continue statin at discharge, if appropriate  R/ O DIABETES: Lab Results  Component Value Date   HGBA1C 5.8 (H) 10/14/2017  HgbA1c goal < 7.0   Other Active Problems: Principal Problem:   Acute right MCA stroke (HCC) Active Problems:   HTN (hypertension)   Hypothyroidism   Acute lower UTI   Lactic acidosis   Toxic metabolic encephalopathy   Acute renal failure superimposed on stage 3 chronic kidney disease (HCC)   Atrial fibrillation with RVR (HCC)   Palliative care by specialist  Hospital day # 1  VTE prophylaxis: SCD's  Diet : Diet Heart Room service appropriate? Yes; Fluid consistency: Thin   Prior Home Stroke Medications: aspirin 81 mg daily Prior to Admission Hospital Current Stroke Medications Stroke New Meds Plan: Now on aspirin 81 mg daily  Discharge Stroke Meds: Please discharge patient on aspirin 81 mg daily   Disposition: 03-Skilled Nursing Facility Therapy Recs:   Follow Recs: SNF  FAMILY UPDATES: family at bedside  TEAM UPDATES: Calvert Cantor, MD STATUS:  Palliative care following. Partial code  I have personally examined this patient, reviewed notes, independently viewed imaging studies, participated in medical decision making and plan of care.ROS completed by me personally and pertinent positives fully documented  I have made any additions or clarifications directly to the above note. Agree with note above. Patient has posterior division right MCA infarct secondary to atrial fibrillation. Given her advanced age and medical  comorbidities the prognosis is poor. Family has decided to do comfort care and DO NOT RESUSCITATE. Agree with the decision to discontinue all further stroke workup. No family available for discussion. Discussed with Dr. Butler Denmark. Greater than 50% time during this 35 minute visit was spent on counseling and coordination of care about embolic stroke, atrial fibrillation and answering questions  Delia Heady, MD Medical Director [facility] Pager: [phone] 10/14/2017 2:22 PM     Neurology to sign-off at this time. Please call with any further questions or concerns. Thank you for this consultation.  To contact Stroke Continuity provider, please refer to [email or web address]. After hours, contact General Neurology

## 2017-10-14 NOTE — Evaluation (Signed)
Physical Therapy Evaluation Patient Details Name: Jennifer Zavala MRN: [id number] DOB: [birth year] Today's Date: 10/14/2017   History of Present Illness  83 yo female admitted from independent living with CT (+) R MCA pending MRI and UTI.   Clinical Impression  Pt admitted with above diagnosis and presents to PT with functional limitations due to deficits listed below (See PT problem list). Pt needs skilled PT to maximize independence and safety to allow discharge to possibly CIR. Expect progress will be dependent on cognitive status. Moving all extremities so expect if cognition improves that mobility will improve.     Follow Up Recommendations CIR    Equipment Recommendations  Other (comment)(To be determined)    Recommendations for Other Services       Precautions / Restrictions Precautions Precautions: Fall Precaution Comments: bil mittens      Mobility  Bed Mobility Overal bed mobility: Needs Assistance Bed Mobility: Supine to Sit;Sit to Supine     Supine to sit: Max assist;+2 for physical assistance Sit to supine: +2 for physical assistance;Min assist   General bed mobility comments: Assist to bring legs off bed, elevate trunk, and bring hips to EOB. Less assist returning to supine due to pt asking and initiating return to supine.  Transfers                 General transfer comment: Attempted to have pt stand but pt refursed and began returning to supine  Ambulation/Gait                Stairs            Wheelchair Mobility    Modified Rankin (Stroke Patients Only) Modified Rankin (Stroke Patients Only) Pre-Morbid Rankin Score: Slight disability Modified Rankin: Severe disability     Balance Overall balance assessment: Needs assistance Sitting-balance support: Feet supported;Bilateral upper extremity supported Sitting balance-Leahy Scale: Poor Sitting balance - Comments: Pt sat EOB x 5-6 minutes with min guard for safety                                       Pertinent Vitals/Pain Pain Assessment: No/denies pain    Home Living Family/patient expects to be discharged to:: Unsure                      Prior Function Level of Independence: Independent               Hand Dominance        Extremity/Trunk Assessment   Upper Extremity Assessment Upper Extremity Assessment: Defer to OT evaluation    Lower Extremity Assessment Lower Extremity Assessment: Difficult to assess due to impaired cognition(Active movement noted bilaterally)    Cervical / Trunk Assessment Cervical / Trunk Assessment: Kyphotic  Communication   Communication: Receptive difficulties  Cognition Arousal/Alertness: Awake/alert Behavior During Therapy: Restless Overall Cognitive Status: No family/caregiver present to determine baseline cognitive functioning Area of Impairment: Orientation;Following commands;Attention;Problem solving                 Orientation Level: Disoriented to;Place;Time;Situation Current Attention Level: Focused   Following Commands: Follows one step commands inconsistently       General Comments: Pt able to state her name. Repeatedly ask to have gloves removed.      General Comments      Exercises     Assessment/Plan    PT Assessment Patient needs continued  PT services  PT Problem List Decreased strength;Decreased activity tolerance;Decreased balance;Decreased mobility;Decreased cognition;Decreased safety awareness;Decreased knowledge of use of DME       PT Treatment Interventions DME instruction;Functional mobility training;Therapeutic activities;Therapeutic exercise;Balance training;Patient/family education;Gait training    PT Goals (Current goals can be found in the Care Plan section)  Acute Rehab PT Goals Patient Stated Goal: Pt unable to state PT Goal Formulation: Patient unable to participate in goal setting Time For Goal Achievement: 10/28/17 Potential  to Achieve Goals: Fair    Frequency Min 3X/week   Barriers to discharge Decreased caregiver support Lives alone?    Co-evaluation               AM-PAC PT "6 Clicks" Daily Activity  Outcome Measure Difficulty turning over in bed (including adjusting bedclothes, sheets and blankets)?: Unable Difficulty moving from lying on back to sitting on the side of the bed? : Unable Difficulty sitting down on and standing up from a chair with arms (e.g., wheelchair, bedside commode, etc,.)?: Unable Help needed moving to and from a bed to chair (including a wheelchair)?: Total Help needed walking in hospital room?: Total Help needed climbing 3-5 steps with a railing? : Total 6 Click Score: 6    End of Session Equipment Utilized During Treatment: Gait belt Activity Tolerance: Other (comment)(limited by cognition) Patient left: in bed;with call bell/phone within reach;with bed alarm set   PT Visit Diagnosis: Other abnormalities of gait and mobility (R26.89);Muscle weakness (generalized) (M62.81);Other symptoms and signs involving the nervous system (R29.898)    Time: [id number] PT Time Calculation (min) (ACUTE ONLY): 15 min   Charges:   PT Evaluation $PT Eval Moderate Complexity: 1 Mod     PT G CodesMarland Kitchen:        [facility] Aishani [facility]avala PT [phone]   Jennifer [facility] Zavala 10/14/2017, 4:28 PM

## 2017-10-14 NOTE — Progress Notes (Addendum)
PROGRESS NOTE    Jennifer Zavala Hight   NWG:[id number]  DOB: [birth year]  DOA: 10/13/2017 PCP: Johny BlamerHarris, William, MD   Brief Narrative:  Jennifer Zavala Canales  a 83 year old woman with a past medical history significant for hypertension, hypothyroidism, coronary artery disease and hyperlipidemia; who presented to ED from her independent living facility due to failure to thrive, changes in mentation including refusing to do activities that she normally enjoyed, not eating, not drinking, being essentially confused, moaning and having difficulty expressing herself at times.  CT scan of the head positive for acute right MCA in setting of new A-fib with RVR.  Lactic acid 4.13  Subjective: Non-verbal in the AM. Later more awake and restless. No complaints.    Assessment & Plan:   Principal Problem:   Acute right MCA stroke  - large stroke with high risk for hemorrhagic conversion but can have aspirin.  - f/u MRI - unable to do carotid duplex as she would not cooperate with moving her arms - Lipid panel> LDL 88, HDL 38 - A1c 5.8 - f/u ECHO ADDENDUM: this afternoon, patient's O2 level was noted to drop to the 70s and at one point she was placed on a BiPAP as 100% FiO2 was not helping. Further discussion with her POA, Herbert PunScott Ricardo was had. She was unable to tolearte the BiPAP due to discomfort and it was decided to make her DNI and leave off the BiPAP.  Just now I have spoken to the 2 brothers and other family in person and have had a long conversation about what to expect. If she does not recuperate in the next 24 hrs, a hospice home will be appropriate for her. If she appears to be struggling with breathing or pain, Morphine can be given to her with permission from the family. They will be present through the night.  There is a meeting with palliative care tomorrow at 10 AM.  Active Problems: - Aifb with RVR - try to control with IV and oral Lopressor- hold off on anticoagulation for now to  prevent hemorrhagic conversion of CVA - TSH normal - f/u ECHO - Addendum: rate still uncontrolled- adding short acting Cardizem     HTN (hypertension) - Lopressor- hold Atenolol     Hypothyroidism - Synthroid    Acute lower UTI - Ceftriaxone- f/u Cultures    Lactic acidosis - in setting of A-fib with RVR- follow    Toxic metabolic encephalopathy - due to CVA vs UTI    Acute renal failure superimposed on stage 3 chronic kidney disease   - baseline Cr 0.8- likely due to poor cardiac output- follow    Palliative care by specialist    DVT prophylaxis: SCDs Code Status: DNR Family Communication:  Disposition Plan: follow in SDU Consultants:   Neuro  pallliative care Procedures:     Antimicrobials:  Anti-infectives (From admission, onward)   Start     Dose/Rate Route Frequency Ordered Stop   10/15/17 0200  cefTRIAXone (ROCEPHIN) 2 g in dextrose 5 % 50 mL IVPB     2 g 100 mL/hr over 30 Minutes Intravenous Every 24 hours 10/14/17 0644     10/14/17 0145  cefTRIAXone (ROCEPHIN) 2 g in dextrose 5 % 50 mL IVPB     2 g 100 mL/hr over 30 Minutes Intravenous  Once 10/14/17 0130 10/14/17 0242   10/13/17 1345  cefTRIAXone (ROCEPHIN) 2 g in dextrose 5 % 50 mL IVPB     2 g 100 mL/hr over 30  Minutes Intravenous  Once 10/13/17 1337 10/13/17 1510       Objective: Vitals:   10/14/17 0607 10/14/17 0710 10/14/17 0909 10/14/17 1000  BP: (!) 151/123 118/71 (!) 111/98 113/69  Pulse: 99     Resp: 20 18 (!) 29 16  Temp:  (!) 97.5 F (36.4 C)    TempSrc:  Axillary    SpO2: (!) 71% 90% 90%   Weight:      Height:        Intake/Output Summary (Last 24 hours) at 10/14/2017 1223 Last data filed at 10/14/2017 0900 Gross per 24 hour  Intake 2612.5 ml  Output -  Net 2612.5 ml   Filed Weights   10/14/17 0117  Weight: 63 kg (139 lb)    Examination: General exam: Appears comfortable  HEENT: PERRLA, oral mucosa moist, no sclera icterus or thrush Respiratory system: Clear to  auscultation. Respiratory effort normal. Cardiovascular system: S1 & S2 heard, RRR.  No murmurs  Gastrointestinal system: Abdomen soft, non-tender, nondistended. Normal bowel sound. No organomegaly Central nervous system: asleep- only opens eyes for a few seconds- difficult to perform neuro exam. Extremities: No cyanosis, clubbing or edema Skin: No rashes or ulcers Psychiatry:  Cannot assess    Data Reviewed: I have personally reviewed following labs and imaging studies  CBC: Recent Labs  Lab 10/13/17 1243 10/14/17 0226  WBC 8.8 12.9*  NEUTROABS 7.3  --   HGB 12.0 11.5*  HCT 36.8 36.6  MCV 97.4 98.7  PLT 289 232   Basic Metabolic Panel: Recent Labs  Lab 10/13/17 1243 10/14/17 0226  NA 139 141  K 4.6 4.8  CL 106 113*  CO2 19* 16*  GLUCOSE 158* 122*  BUN 58* 53*  CREATININE 1.55* 1.58*  CALCIUM 9.0 8.3*   GFR: Estimated Creatinine Clearance: 18.2 mL/min (A) (by C-G formula based on SCr of 1.58 mg/dL (Zavala)). Liver Function Tests: Recent Labs  Lab 10/13/17 1243  AST 64*  ALT 72*  ALKPHOS 111  BILITOT 1.3*  PROT 7.7  ALBUMIN 4.0   No results for input(s): LIPASE, AMYLASE in the last 168 hours. No results for input(s): AMMONIA in the last 168 hours. Coagulation Profile: No results for input(s): INR, PROTIME in the last 168 hours. Cardiac Enzymes: No results for input(s): CKTOTAL, CKMB, CKMBINDEX, TROPONINI in the last 168 hours. BNP (last 3 results) No results for input(s): PROBNP in the last 8760 hours. HbA1C: Recent Labs    10/14/17 0226  HGBA1C 5.8*   CBG: No results for input(s): GLUCAP in the last 168 hours. Lipid Profile: Recent Labs    10/14/17 0226  CHOL 145  HDL 34*  LDLCALC 88  TRIG 161116  CHOLHDL 4.3   Thyroid Function Tests: Recent Labs    10/13/17 2037  TSH 2.146   Anemia Panel: No results for input(s): VITAMINB12, FOLATE, FERRITIN, TIBC, IRON, RETICCTPCT in the last 72 hours. Urine analysis:    Component Value Date/Time    COLORURINE AMBER (A) 10/13/2017 1245   APPEARANCEUR HAZY (A) 10/13/2017 1245   LABSPEC 1.018 10/13/2017 1245   PHURINE 5.0 10/13/2017 1245   GLUCOSEU NEGATIVE 10/13/2017 1245   HGBUR NEGATIVE 10/13/2017 1245   BILIRUBINUR NEGATIVE 10/13/2017 1245   KETONESUR 5 (A) 10/13/2017 1245   PROTEINUR 100 (A) 10/13/2017 1245   NITRITE NEGATIVE 10/13/2017 1245   LEUKOCYTESUR NEGATIVE 10/13/2017 1245   Sepsis Labs: @LABRCNTIP (procalcitonin:4,lacticidven:4) ) Recent Results (from the past 240 hour(s))  Urine culture     Status: Abnormal (Preliminary result)  Collection Time: 10/13/17 12:45 PM  Result Value Ref Range Status   Specimen Description URINE, CATHETERIZED  Final   Special Requests Normal  Final   Culture 30,000 COLONIES/mL GRAM NEGATIVE RODS (A)  Final   Report Status PENDING  Incomplete  MRSA PCR Screening     Status: None   Collection Time: 10/14/17  1:30 AM  Result Value Ref Range Status   MRSA by PCR NEGATIVE NEGATIVE Final    Comment:        The GeneXpert MRSA Assay (FDA approved for NASAL specimens only), is one component of a comprehensive MRSA colonization surveillance program. It is not intended to diagnose MRSA infection nor to guide or monitor treatment for MRSA infections.          Radiology Studies: Ct Head Wo Contrast  Result Date: 10/13/2017 CLINICAL DATA:  Mental status changes. EXAM: CT HEAD WITHOUT CONTRAST TECHNIQUE: Contiguous axial images were obtained from the base of the skull through the vertex without intravenous contrast. COMPARISON:  07/21/2012 FINDINGS: Brain: Moderate low density in the periventricular white matter likely related to small vessel disease. Cortically based infarct involving the posterior right MCA distribution, including on image 18/series 2. Sulci effacement. No midline shift. Expected cerebral and cerebellar volume loss. No hydrocephalus, hemorrhage, intra-axial, or extra-axial fluid collection. Vascular: Intracranial  atherosclerosis. Skull: Normal Sinuses/Orbits: Normal imaged portions of the orbits and globes. Clear paranasal sinuses and mastoid air cells. Other: None. IMPRESSION: 1. Right posterior middle cerebral artery distribution cortically based infarct, favored to be subacute. Sulci effacement but no significant midline shift. 2.  Cerebral atrophy and small vessel ischemic change. These results were called by telephone at the time of interpretation on 10/13/2017 at 3:53 pm to Dr. Alvira Monday , who verbally acknowledged these results. Electronically Signed   By: Jeronimo Greaves M.D.   On: 10/13/2017 15:55      Scheduled Meds: . aspirin EC  81 mg Oral Daily  . levothyroxine  25 mcg Intravenous Daily  . metoprolol tartrate  25 mg Oral BID  . pantoprazole (PROTONIX) IV  40 mg Intravenous Q24H   Continuous Infusions: . sodium chloride 50 mL/hr at 10/14/17 0600  . [START ON 10/15/2017] cefTRIAXone (ROCEPHIN)  IV       LOS: 1 day    Time spent in minutes: 45    Calvert Cantor, MD [facility] Pager: [email or web address] Password [facility] 10/14/2017, 12:23 PM

## 2017-10-14 NOTE — Progress Notes (Signed)
CRITICAL VALUE ALERT  Critical Value:  3.3 Lactic acid  Date & Time Notied:  10/14/2017 2015  Provider Notified: Bruna PotterBlount  Orders Received/Actions taken: no orders given

## 2017-10-14 NOTE — CV Procedure (Signed)
2D echo attempted, but RN said try later.

## 2017-10-14 NOTE — Consult Note (Signed)
Consultation Note Date: 10/14/2017   Patient Name: Jennifer Zavala  DOB: 04/19/1916  MRN: [id number]  Age / Sex: 83 y.o., female  PCP: Johny BlamerHarris, William, MD Referring Physician: Calvert Cantorizwan, Saima, MD  Reason for Consultation: Establishing goals of care and Psychosocial/spiritual support  HPI/Patient Profile: 83 y.o. female  with past medical history of hypertension, hyperlipidemia, coronary artery disease admitted on 10/13/2017 with decreased oral intake, altered mental status.  Per CT scan, patient was found to have an acute MCA stroke in the setting of new a fib.  Per neurology's note, patient is a high risk for hemorrhagic conversion.  Consult ordered for goals of care.   Clinical Assessment and Goals of Care: Chart reviewed, patient seen.  Discussed with bedside RN.  Patient minimally responsive to voice and light touch with this writer, but per RN, has ordered a few words and also took a pill with applesauce.  She is currently wearing mittens.  Per RN, patient did pass her swallow eval.  I do not see speech and language note at the time of this assessment.  MRI, 2D echo, carotid Doppler workup is pending.  Per chart review, her son, [name], from [place] is on his way to [place], expected to arrive late on 10/14/2017.  Unfortunately at this point, patient cannot participate in goals of care discussion  NEXT OF KIN.  Patient has 2 sons, [name] at [phone]; and Ed [phone]. Herbert PunScott Hopfer is listed as healthcare power of attorney  She is currently a partial code: Okay to intubate, BiPAP, ACLS medication; no CPR or defibrillation    SUMMARY OF RECOMMENDATIONS   Palliative medicine team to meet with patient's sons, Lorin PicketScott and Ed at [employer]10 AM Code Status/Advance Care Planning:  Limited code   Palliative Prophylaxis:   Aspiration, Bowel Regimen, Delirium Protocol, Eye Care,  Frequent Pain Assessment, Oral Care and Turn Reposition  Additional Recommendations (Limitations, Scope, Preferences):  Full Scope Treatment except for CPR, defibrillation  Psycho-social/Spiritual:   Desire for further Chaplaincy support:no  Additional Recommendations: Referral to Community Resources   Prognosis:   Unable to determine  Discharge Planning: To Be Determined      Primary Diagnoses: Present on Admission: . Acute right MCA stroke (HCC) . Hypothyroidism . HTN (hypertension) . Acute lower UTI . Lactic acidosis . Toxic metabolic encephalopathy . Acute renal failure superimposed on stage 3 chronic kidney disease (HCC)   I have reviewed the medical record, interviewed the patient and family, and examined the patient. The following aspects are pertinent.  Past Medical History:  Diagnosis Date  . Angina at rest [facility](HCC)   . Aortic aneurysm (HCC)   . Coronary artery disease   . Hypertension   . Hypothyroid    Social History   Socioeconomic History  . Marital status: Widowed    Spouse name: None  . Number of children: None  . Years of education: None  . Highest education level: None  Social Needs  . Financial resource strain: None  . Food insecurity -  worry: None  . Food insecurity - inability: None  . Transportation needs - medical: None  . Transportation needs - non-medical: None  Occupational History  . None  Tobacco Use  . Smoking status: Former Games developer  . Smokeless tobacco: Never Used  Substance and Sexual Activity  . Alcohol use: No  . Drug use: No  . Sexual activity: No  Other Topics Concern  . None  Social History Narrative  . None   History reviewed. No pertinent family history. Scheduled Meds: . levothyroxine  25 mcg Intravenous Daily  . LORazepam  1 mg Intravenous On Call  . metoprolol tartrate  25 mg Oral BID  . pantoprazole (PROTONIX) IV  40 mg Intravenous Q24H   Continuous Infusions: . sodium chloride 50 mL/hr at 10/14/17 0600    . [START ON 10/15/2017] cefTRIAXone (ROCEPHIN)  IV     PRN Meds:.acetaminophen **OR** acetaminophen (TYLENOL) oral liquid 160 mg/5 mL **OR** acetaminophen, metoprolol tartrate, senna-docusate Medications Prior to Admission:  Prior to Admission medications   Medication Sig Start Date End Date Taking? Authorizing Provider  aspirin EC 81 MG tablet Take 81 mg by mouth daily.   Yes [provider]  atenolol (TENORMIN) 50 MG tablet Take 50 mg by mouth daily.   Yes [provider]  ENSURE PLUS (ENSURE PLUS) LIQD Take 237 mLs by mouth 3 (three) times daily between meals.   Yes [provider]  levothyroxine (SYNTHROID, LEVOTHROID) 50 MCG tablet Take 50 mcg by mouth daily before breakfast.   Yes [provider]  Multiple Vitamin (MULTIVITAMIN WITH MINERALS) TABS Take 1 tablet by mouth daily.   Yes [provider]  nitroGLYCERIN (NITROSTAT) 0.4 MG SL tablet Place 0.4 mg under the tongue every 5 (five) minutes as needed. For chest pain   Yes [provider]  cholecalciferol (VITAMIN D) 1000 UNITS tablet Take 1,000 Units by mouth daily.    [provider]  levothyroxine (SYNTHROID, LEVOTHROID) 25 MCG tablet Take 25 mcg by mouth daily before breakfast.    [provider]  olmesartan (BENICAR) 40 MG tablet Take 20 mg by mouth daily.     [provider]  Omega 3-6-9 Fatty Acids LIQD Take 30 mLs by mouth daily.    [provider]  pravastatin (PRAVACHOL) 40 MG tablet Take 40 mg by mouth daily.    [provider]  simvastatin (ZOCOR) 40 MG tablet Take 40 mg by mouth every evening.    [provider]   Allergies  Allergen Reactions  . Sulfa Antibiotics     unknown   Review of Systems  Unable to perform ROS: Acuity of condition    Physical Exam  Constitutional: She appears well-developed and well-nourished.  Acutely ill, minimally responsive elderly female lying in bed; no acute distress, she is  wearing mittens  Cardiovascular:  Tachycardic  Pulmonary/Chest: Effort normal.  Neurological:  Minimally responsive to voice and touch  Psychiatric:  Unable to test  Nursing note and vitals reviewed.   Vital Signs: BP (!) 111/98 (BP Location: Right Arm)   Pulse 99   Temp (!) 97.5 F (36.4 C) (Axillary)   Resp (!) 29   Ht 5\' 6"  (1.676 m)   Wt 63 kg (139 lb)   SpO2 90%   BMI 22.44 kg/m  Pain Assessment: PAINAD   Pain Score: Asleep   SpO2: SpO2: 90 % O2 Device:SpO2: 90 % O2 Flow Rate: .O2 Flow Rate (L/min): 1.5 L/min  IO: Intake/output summary:   Intake/Output  Summary (Last 24 hours) at 10/14/2017 1016 Last data filed at 10/14/2017 0900 Gross per 24 hour  Intake 2612.5 ml  Output -  Net 2612.5 ml    LBM:   Baseline Weight: Weight: 63 kg (139 lb) Most recent weight: Weight: 63 kg (139 lb)     Palliative Assessment/Data:   Flowsheet Rows     Most Recent Value  Intake Tab  Referral Department  Hospitalist  Unit at Time of Referral  Intermediate Care Unit  Palliative Care Primary Diagnosis  Neurology  Date Notified  10/13/17  Palliative Care Type  New Palliative care  Reason for referral  Clarify Goals of Care  Date of Admission  10/13/17  Date first seen by Palliative Care  10/14/17  # of days Palliative referral response time  1 Day(s)  # of days IP prior to Palliative referral  0  Clinical Assessment  Palliative Performance Scale Score  30%  Pain Max last 24 hours  Not able to report  Pain Min Last 24 hours  Not able to report  Dyspnea Max Last 24 Hours  Not able to report  Dyspnea Min Last 24 hours  Not able to report  Nausea Max Last 24 Hours  Not able to report  Nausea Min Last 24 Hours  Not able to report  Anxiety Max Last 24 Hours  Not able to report  Anxiety Min Last 24 Hours  Not able to report  Other Max Last 24 Hours  Not able to report  Psychosocial & Spiritual Assessment  Palliative Care Outcomes  Patient/Family meeting held?  No    Palliative Care follow-up planned  Yes, Facility      Time In: 0900 Time Out: 0955 Time Total: 55 min Greater than 50%  of this time was spent counseling and coordinating care related to the above assessment and plan.  Signed by: Irean HongSarah Grace Shant Hence, NP   Please contact Palliative Medicine Team phone at [phone] for questions and concerns.  For individual provider: See Loretha StaplerAmion

## 2017-10-14 NOTE — Progress Notes (Addendum)
Inpatient Rehabilitation  Per OT request, patient was screened by Fae PippinMelissa Damek Ende for appropriateness for an Inpatient Acute Rehab consult.  At this time, we are recommending Inpatient Rehab consult.  Text paged MD to notify; please order consult if you are agreeable.  Weldon PickingSusan Blankenship PT Inpatient Rehab Admissions Coordinator Cell [phone] Office [phone]

## 2017-10-14 NOTE — Plan of Care (Signed)
Continue current care plan 

## 2017-10-14 NOTE — Progress Notes (Signed)
Attempted carotid duplex, but patient is laying with arms forcefully crossed over head and will not move them for this test. Please advise if patient becomes willing for test. Farrel DemarkJill Eunice, RDMS, RVT

## 2017-10-14 NOTE — ED Notes (Signed)
Verbal consent to transfer obtained for son Andy Gaussd [place]

## 2017-10-14 NOTE — Progress Notes (Addendum)
Initial Nutrition Assessment  DOCUMENTATION CODES:   Not applicable  INTERVENTION:    Offer Ensure Enlive po BID when patient is more alert and able to take PO's, each supplement provides 350 kcal and 20 grams of protein  NUTRITION DIAGNOSIS:   Inadequate oral intake related to lethargy/confusion as evidenced by meal completion < 25%.  GOAL:   Patient will meet greater than or equal to 90% of their needs  MONITOR:   PO intake, Supplement acceptance  REASON FOR ASSESSMENT:   Malnutrition Screening Tool    ASSESSMENT:   83 yo female with PMH of HTN, hypothyroidism, CAD, and HLD, who was admitted on 11/29 from inependent living facility with failure to thrive, confusion, not eating or drinking. CT head revealed acute R MCA with new A fib with RVR.  Patient was not alert during RD visit; unable to obtain any nutrition hx.  Unable to complete nutrition focused physical exam. Patient was lying in bed in a fixed position, unable to move extremities.  Patient apparently passed the RN stroke swallow screen and diet was advanced to heart healthy. Per RN, patient has not had anything to eat today.  Palliative Care team is following. Son is coming in from [place] today. Plans for meeting regarding goals of care tomorrow.  Labs and medications reviewed.   NUTRITION - FOCUSED PHYSICAL EXAM:  unable to complete  Diet Order:  Diet Heart Room service appropriate? Yes; Fluid consistency: Thin  EDUCATION NEEDS:   No education needs have been identified at this time  Skin:  Skin Assessment: Skin Integrity Issues: Skin Integrity Issues:: (pressure injury to sacrum)  Last BM:  unknown  Height:   Ht Readings from Last 1 Encounters:  10/14/17 5\' 6"  (1.676 m)    Weight:   Wt Readings from Last 1 Encounters:  10/14/17 139 lb (63 kg)    Ideal Body Weight:  59.1 kg  BMI:  Body mass index is 22.44 kg/m.  Estimated Nutritional Needs:   Kcal:  1400-1600  Protein:  70-80  gm  Fluid:  1.4-1.6 L   Joaquin CourtsKimberly Harris, RD, LDN, CNSC Pager [phone] After Hours Pager [phone]

## 2017-10-15 ENCOUNTER — Inpatient Hospital Stay (HOSPITAL_COMMUNITY): Payer: Medicare Other

## 2017-10-15 DIAGNOSIS — J810 Acute pulmonary edema: Secondary | ICD-10-CM

## 2017-10-15 DIAGNOSIS — N179 Acute kidney failure, unspecified: Secondary | ICD-10-CM

## 2017-10-15 DIAGNOSIS — R0902 Hypoxemia: Secondary | ICD-10-CM

## 2017-10-15 LAB — BASIC METABOLIC PANEL
Anion gap: 14 (ref 5–15)
BUN: 52 mg/dL — AB (ref 6–20)
CALCIUM: 8 mg/dL — AB (ref 8.9–10.3)
CHLORIDE: 115 mmol/L — AB (ref 101–111)
CO2: 16 mmol/L — AB (ref 22–32)
CREATININE: 1.54 mg/dL — AB (ref 0.44–1.00)
GFR calc Af Amer: 31 mL/min — ABNORMAL LOW (ref >60)
GFR calc non Af Amer: 27 mL/min — ABNORMAL LOW (ref >60)
GLUCOSE: 72 mg/dL (ref 65–99)
Potassium: 4.2 mmol/L (ref 3.5–5.1)
Sodium: 145 mmol/L (ref 135–145)

## 2017-10-15 LAB — URINE CULTURE: SPECIAL REQUESTS: NORMAL

## 2017-10-15 LAB — CBC
HCT: 38.3 % (ref 36.0–46.0)
Hemoglobin: 12.5 g/dL (ref 12.0–15.0)
MCH: 32.1 pg (ref 26.0–34.0)
MCHC: 32.6 g/dL (ref 30.0–36.0)
MCV: 98.5 fL (ref 78.0–100.0)
PLATELETS: 187 10*3/uL (ref 150–400)
RBC: 3.89 MIL/uL (ref 3.87–5.11)
RDW: 17.6 % — ABNORMAL HIGH (ref 11.5–15.5)
WBC: 11.1 10*3/uL — ABNORMAL HIGH (ref 4.0–10.5)

## 2017-10-15 MED ORDER — FUROSEMIDE 10 MG/ML IJ SOLN
40.0000 mg | Freq: Two times a day (BID) | INTRAMUSCULAR | Status: DC
  Administered 2017-10-15 – 2017-10-16 (×3): 40 mg via INTRAVENOUS
  Filled 2017-10-15 (×3): qty 4

## 2017-10-15 MED ORDER — DILTIAZEM HCL 100 MG IV SOLR
5.0000 mg/h | INTRAVENOUS | Status: DC
  Administered 2017-10-15 – 2017-10-16 (×2): 10 mg/h via INTRAVENOUS
  Filled 2017-10-15 (×3): qty 100

## 2017-10-15 NOTE — Progress Notes (Signed)
PROGRESS NOTE    Jennifer Zavala   WUJ:[id number]  DOB: 1916/07/31  DOA: 10/13/2017 PCP: Johny Blamer, MD   Brief Narrative:  Jennifer Zavala  a 83 year old woman with a past medical history significant for hypertension, hypothyroidism, coronary artery disease and hyperlipidemia; who presented to ED from her independent living facility due to failure to thrive, changes in mentation including refusing to do activities that she normally enjoyed, not eating, not drinking, being essentially confused, moaning and having difficulty expressing herself at times.  CT scan of the head positive for acute right MCA in setting of new A-fib with RVR.  Lactic acid 4.13  Subjective: Very restless. Has no complaints.    Assessment & Plan:   Principal Problem:   Acute right MCA stroke  - large stroke with high risk for hemorrhagic conversion but can have aspirin.  - f/u MRI - unable to do carotid duplex as she would not cooperate with moving her arms - Lipid panel> LDL 88, HDL 38 - A1c 5.8 - f/u ECHO - 11/30- patient's O2 level was noted to drop to the 70s and at one point she was placed on a BiPAP as 100% FiO2 was not helping. Further discussion with her POA, Jennifer Zavala was had. She was unable to tolerare the BiPAP due to discomfort and it was decided to make her DNI and leave off the BiPAP.   - does not appear to be able to swallow today- have spoken with family today that if she continues to be able to not swallow tomorrow, I would recommend hospice  Active Problems: - Aifb with RVR - try to control with IV and oral Lopressor- hold off on anticoagulation for now to prevent hemorrhagic conversion of CVA - TSH normal - rate still uncontrolled- 11/30 added short acting Cardizem but have found out today that she cannot swallow- will start Cardizem infusion today  Fluid overloaded - start IV Lasix- stop IVF    HTN (hypertension) - Lopressor IV- hold Atenolol      Hypothyroidism - Synthroid    Acute lower UTI - Ceftriaxone- f/u Cultures    Lactic acidosis - in setting of A-fib with RVR- follow    Toxic metabolic encephalopathy - due to CVA vs UTI    Acute renal failure superimposed on stage 3 chronic kidney disease   - baseline Cr 0.8- likely due to poor cardiac output- follow    Palliative care by specialist    DVT prophylaxis: SCDs Code Status: DNR Family Communication:  Disposition Plan: follow in SDU Consultants:   Neuro  pallliative care Procedures:     Antimicrobials:  Anti-infectives (From admission, onward)   Start     Dose/Rate Route Frequency Ordered Stop   10/15/17 0200  cefTRIAXone (ROCEPHIN) 2 g in dextrose 5 % 50 mL IVPB  Status:  Discontinued     2 g 100 mL/hr over 30 Minutes Intravenous Every 24 hours 10/14/17 0644 10/14/17 1633   10/14/17 0145  cefTRIAXone (ROCEPHIN) 2 g in dextrose 5 % 50 mL IVPB     2 g 100 mL/hr over 30 Minutes Intravenous  Once 10/14/17 0130 10/14/17 0242   10/13/17 1345  cefTRIAXone (ROCEPHIN) 2 g in dextrose 5 % 50 mL IVPB     2 g 100 mL/hr over 30 Minutes Intravenous  Once 10/13/17 1337 10/13/17 1510       Objective: Vitals:   10/14/17 1927 10/15/17 0013 10/15/17 0438 10/15/17 0800  BP: (!) 124/103 92/61 138/77  Pulse: 89 94 (!) 102   Resp: 13 16 19    Temp: (!) 97.5 F (36.4 C) (!) 96.4 F (35.8 C) (!) 97.3 F (36.3 C) (!) 97.5 F (36.4 C)  TempSrc: Axillary Axillary Axillary Axillary  SpO2: 98% 93% 91%   Weight:      Height:        Intake/Output Summary (Last 24 hours) at 10/15/2017 1129 Last data filed at 10/15/2017 0956 Gross per 24 hour  Intake 400 ml  Output -  Net 400 ml   Filed Weights   10/14/17 0117  Weight: 63 kg (139 lb)    Examination: General exam: Appears comfortable  HEENT: PERRLA, oral mucosa moist, no sclera icterus or thrush Respiratory system: Clear to auscultation. Respiratory effort normal. Cardiovascular system: S1 & S2 heard, RRR.  No  murmurs  Gastrointestinal system: Abdomen soft, non-tender, nondistended. Normal bowel sound. No organomegaly Central nervous system: alert, moves arms and legs well- does not always follow command and does not precipitate with exam Extremities: No cyanosis, clubbing or edema Skin: No rashes or ulcers Psychiatry:  Cannot assess    Data Reviewed: I have personally reviewed following labs and imaging studies  CBC: Recent Labs  Lab 10/13/17 1243 10/14/17 0226 10/15/17 0303  WBC 8.8 12.9* 11.1*  NEUTROABS 7.3  --   --   HGB 12.0 11.5* 12.5  HCT 36.8 36.6 38.3  MCV 97.4 98.7 98.5  PLT 289 232 187   Basic Metabolic Panel: Recent Labs  Lab 10/13/17 1243 10/14/17 0226 10/15/17 0303  NA 139 141 145  K 4.6 4.8 4.2  CL 106 113* 115*  CO2 19* 16* 16*  GLUCOSE 158* 122* 72  BUN 58* 53* 52*  CREATININE 1.55* 1.58* 1.54*  CALCIUM 9.0 8.3* 8.0*   GFR: Estimated Creatinine Clearance: 18.6 mL/min (A) (by C-G formula based on SCr of 1.54 mg/dL (H)). Liver Function Tests: Recent Labs  Lab 10/13/17 1243  AST 64*  ALT 72*  ALKPHOS 111  BILITOT 1.3*  PROT 7.7  ALBUMIN 4.0   No results for input(s): LIPASE, AMYLASE in the last 168 hours. No results for input(s): AMMONIA in the last 168 hours. Coagulation Profile: No results for input(s): INR, PROTIME in the last 168 hours. Cardiac Enzymes: No results for input(s): CKTOTAL, CKMB, CKMBINDEX, TROPONINI in the last 168 hours. BNP (last 3 results) No results for input(s): PROBNP in the last 8760 hours. HbA1C: Recent Labs    10/14/17 0226  HGBA1C 5.8*   CBG: No results for input(s): GLUCAP in the last 168 hours. Lipid Profile: Recent Labs    10/14/17 0226  CHOL 145  HDL 34*  LDLCALC 88  TRIG [id number]  CHOLHDL 4.3   Thyroid Function Tests: Recent Labs    10/13/17 2037  TSH 2.146   Anemia Panel: No results for input(s): VITAMINB12, FOLATE, FERRITIN, TIBC, IRON, RETICCTPCT in the last 72 hours. Urine analysis:     Component Value Date/Time   COLORURINE AMBER (A) 10/13/2017 1245   APPEARANCEUR HAZY (A) 10/13/2017 1245   LABSPEC 1.018 10/13/2017 1245   PHURINE 5.0 10/13/2017 1245   GLUCOSEU NEGATIVE 10/13/2017 1245   HGBUR NEGATIVE 10/13/2017 1245   BILIRUBINUR NEGATIVE 10/13/2017 1245   KETONESUR 5 (A) 10/13/2017 1245   PROTEINUR 100 (A) 10/13/2017 1245   NITRITE NEGATIVE 10/13/2017 1245   LEUKOCYTESUR NEGATIVE 10/13/2017 1245   Sepsis Labs: @LABRCNTIP (procalcitonin:4,lacticidven:4) ) Recent Results (from the past 240 hour(s))  Blood Culture (routine x 2)     Status:  None (Preliminary result)   Collection Time: 10/13/17 12:35 PM  Result Value Ref Range Status   Specimen Description BLOOD LEFT ANTECUBITAL  Final   Special Requests   Final    BOTTLES DRAWN AEROBIC AND ANAEROBIC Blood Culture adequate volume   Culture   Final    NO GROWTH < 24 HOURS Performed at [facility], [address], [place], [place] [address]    Report Status PENDING  Incomplete  Urine culture     Status: Abnormal   Collection Time: 10/13/17 12:45 PM  Result Value Ref Range Status   Specimen Description URINE, CATHETERIZED  Final   Special Requests Normal  Final   Culture 30,000 COLONIES/mL ESCHERICHIA COLI (A)  Final   Report Status 10/15/2017 FINAL  Final   Organism ID, Bacteria ESCHERICHIA COLI (A)  Final      Susceptibility   Escherichia coli - MIC*    AMPICILLIN 4 SENSITIVE Sensitive     CEFAZOLIN <=4 SENSITIVE Sensitive     CEFTRIAXONE <=1 SENSITIVE Sensitive     CIPROFLOXACIN <=0.25 SENSITIVE Sensitive     GENTAMICIN <=1 SENSITIVE Sensitive     IMIPENEM <=0.25 SENSITIVE Sensitive     NITROFURANTOIN 64 INTERMEDIATE Intermediate     TRIMETH/SULFA <=20 SENSITIVE Sensitive     AMPICILLIN/SULBACTAM <=2 SENSITIVE Sensitive     PIP/TAZO <=4 SENSITIVE Sensitive     Extended ESBL NEGATIVE Sensitive     * 30,000 COLONIES/mL ESCHERICHIA COLI  Blood Culture (routine x 2)     Status: None (Preliminary  result)   Collection Time: 10/13/17  2:30 PM  Result Value Ref Range Status   Specimen Description BLOOD RIGHT ANTECUBITAL  Final   Special Requests   Final    BOTTLES DRAWN AEROBIC AND ANAEROBIC Blood Culture adequate volume   Culture   Final    NO GROWTH < 24 HOURS Performed at [facility], [address], [place], [place] [address]    Report Status PENDING  Incomplete  MRSA PCR Screening     Status: None   Collection Time: 10/14/17  1:30 AM  Result Value Ref Range Status   MRSA by PCR NEGATIVE NEGATIVE Final    Comment:        The GeneXpert MRSA Assay (FDA approved for NASAL specimens only), is one component of a comprehensive MRSA colonization surveillance program. It is not intended to diagnose MRSA infection nor to guide or monitor treatment for MRSA infections.          Radiology Studies: Ct Head Wo Contrast  Result Date: 10/13/2017 CLINICAL DATA:  Mental status changes. EXAM: CT HEAD WITHOUT CONTRAST TECHNIQUE: Contiguous axial images were obtained from the base of the skull through the vertex without intravenous contrast. COMPARISON:  07/21/2012 FINDINGS: Brain: Moderate low density in the periventricular white matter likely related to small vessel disease. Cortically based infarct involving the posterior right MCA distribution, including on image 18/series 2. Sulci effacement. No midline shift. Expected cerebral and cerebellar volume loss. No hydrocephalus, hemorrhage, intra-axial, or extra-axial fluid collection. Vascular: Intracranial atherosclerosis. Skull: Normal Sinuses/Orbits: Normal imaged portions of the orbits and globes. Clear paranasal sinuses and mastoid air cells. Other: None. IMPRESSION: 1. Right posterior middle cerebral artery distribution cortically based infarct, favored to be subacute. Sulci effacement but no significant midline shift. 2.  Cerebral atrophy and small vessel ischemic change. These results were called by telephone at the time of  interpretation on 10/13/2017 at 3:53 pm to Dr. Alvira Monday ,  who verbally acknowledged these results. Electronically Signed   By: Jeronimo GreavesKyle  Talbot M.D.   On: 10/13/2017 15:55   Dg Chest Port 1 View  Result Date: 10/15/2017 CLINICAL DATA:  Hypoxia EXAM: PORTABLE CHEST 1 VIEW COMPARISON:  None. FINDINGS: Cardiomegaly with vascular congestion. Bilateral lower lobe airspace opacities, left greater than right could reflect asymmetric edema or infection. Suspect small left effusion. No acute bony abnormality. IMPRESSION: Bilateral airspace opacities, left greater than right which could reflect asymmetric edema or infection. Small left effusion. Electronically Signed   By: Charlett NoseKevin  Dover M.D.   On: 10/15/2017 08:54      Scheduled Meds: . diltiazem  30 mg Oral Q8H  . furosemide  40 mg Intravenous BID  . levothyroxine  25 mcg Intravenous Daily  . metoprolol tartrate  25 mg Oral BID  . pantoprazole (PROTONIX) IV  40 mg Intravenous Q24H   Continuous Infusions:    LOS: 2 days    Time spent in minutes: 45    Calvert CantorSaima Cabot Cromartie, MD [facility] Pager: [email or web address] Password [email or web address] 10/15/2017, 11:29 AM

## 2017-10-15 NOTE — Progress Notes (Signed)
Daily Progress Note   Patient Name: Jennifer Zavala       Date: 10/15/2017 DOB: Jul 17, 1916  Age: 83 y.o. MRN#: [id number] Attending Physician: Jennifer Odea, MD Primary Care Physician: Jennifer Frees, MD Admit Date: 10/13/2017  Reason for Consultation/Follow-up: Establishing goals of care and Psychosocial/spiritual support  Subjective: Met with patient, and patient's sons, Jennifer Zavala and Jennifer Zavala ,and their spouses.  Jennifer Zavala and his wife just drove down from [place].  Patient has a daughter who lives in [place] who is flying in this evening.  Despite being 83 years old, patient has been very independent and living on her own up until this Jennifer.  She did attend rehab in the past secondary to a shoulder injury and reportedly stated "never wanted to do that again".  Her daughter-in-law shares that they go to church every Sunday and have "girl's day out "every Thursday.  Despite intellectually recognizing that their mother is 7 years old, and now has an acute right MCA stroke they still feel "blindsided" by this acute change.  Patient apparently called her son Jennifer Zavala from her hospital room on 10/14/2017.  Today when we see her and awaken her, she clearly seems to recognize her family, reaches out her right hand to grab their hand, but then seems to be in distress, moaning and crying out ..   On 10/14/2017 patient developed hypoxia atrial fib with RVR and was placed on BiPAP.  Patient was unable to tolerate BiPAP and is now on a not 100% nonrebreather.  Chest x-ray today reflects bilateral airspace opacities questionable edema versus infection..  Patient also has an elevated lactic acid level of 3.13.3.  She has received 1 dose of morphine for shortness of breath/restlessness/pain and tolerated 1 mg very well  with relief of symptoms her speech is dysarthric, difficult to understand  Length of Stay: 2  Current Medications: Scheduled Meds:  . furosemide  40 mg Intravenous BID  . levothyroxine  25 mcg Intravenous Daily  . metoprolol tartrate  25 mg Oral BID  . pantoprazole (PROTONIX) IV  40 mg Intravenous Q24H    Continuous Infusions: . diltiazem (CARDIZEM) infusion      PRN Meds: acetaminophen **OR** acetaminophen (TYLENOL) oral liquid 160 mg/5 mL **OR** acetaminophen, feeding supplement (ENSURE ENLIVE), metoprolol tartrate, morphine injection, senna-docusate  Physical Exam  Constitutional:  Acutely ill, elderly female crying out in distress  Cardiovascular:  Tachycardic, atrial fib  Pulmonary/Chest:  Patient is wearing 100% nonrebreather  Neurological:  When awake becomes agitated, moaning crying out  Skin: Skin is warm and dry. There is pallor.  Psychiatric:  Anxious, agitated when awake  Nursing note and vitals reviewed.           Vital Signs: BP (!) 126/111 (BP Location: Right Arm)   Zavala (!) 120   Temp (!) 96.4 F (35.8 C) (Axillary)   Resp 19   Ht '5\' 6"'$  (1.676 m)   Wt 63 kg (139 lb)   SpO2 92%   BMI 22.44 kg/m  SpO2: SpO2: 92 % O2 Device: O2 Device: NRB O2 Flow Rate: O2 Flow Rate (L/min): 15 L/min  Intake/output summary:   Intake/Output Summary (Last 24 hours) at 10/15/2017 1445 Last data filed at 10/15/2017 8182 Gross per 24 hour  Intake 400 ml  Output -  Net 400 ml   LBM: Last BM Date: 10/12/17 Baseline Weight: Weight: 63 kg (139 lb) Most recent weight: Weight: 63 kg (139 lb)       Palliative Assessment/Data:    Flowsheet Rows     Most Recent Value  Intake Tab  Referral Department  Hospitalist  Unit at Time of Referral  Intermediate Care Unit  Palliative Care Primary Diagnosis  Neurology  Date Notified  10/13/17  Palliative Care Type  New Palliative care  Reason for referral  Clarify Goals of Care  Date of Admission  10/13/17  Date first  seen by Palliative Care  10/14/17  # of days Palliative referral response time  1 Day(s)  # of days IP prior to Palliative referral  0  Clinical Assessment  Palliative Performance Scale Score  30%  Pain Max last 24 hours  Not able to report  Pain Min Last 24 hours  Not able to report  Dyspnea Max Last 24 Hours  Not able to report  Dyspnea Min Last 24 hours  Not able to report  Nausea Max Last 24 Hours  Not able to report  Nausea Min Last 24 Hours  Not able to report  Anxiety Max Last 24 Hours  Not able to report  Anxiety Min Last 24 Hours  Not able to report  Other Max Last 24 Hours  Not able to report  Psychosocial & Spiritual Assessment  Palliative Care Outcomes  Patient/Family meeting held?  No  Palliative Care follow-up planned  Yes, Facility      Patient Active Problem List   Diagnosis Date Noted  . Atrial fibrillation with RVR ([place])   . Palliative care by specialist   . Acute right MCA stroke ([place]) 10/13/2017  . Acute lower UTI 10/13/2017  . Lactic acidosis 10/13/2017  . Toxic metabolic encephalopathy 99/37/1696  . Acute renal failure superimposed on stage 3 chronic kidney disease ([place]) 10/13/2017  . Fracture of left humerus 07/21/2012  . HTN (hypertension) 07/21/2012  . Hypothyroidism 07/21/2012  . Aortic aneurysm ([place]) 07/21/2012    Palliative Care Assessment & Plan   Patient Profile: 83 y.o. female  with past medical history of hypertension, hyperlipidemia, coronary artery disease admitted on 10/13/2017 with decreased oral intake, altered mental status.  Per CT scan, patient was found to have an acute MCA stroke in the setting of new a fib.  Per neurology's note, patient is a high risk for hemorrhagic conversion.  Consult ordered for goals of care.   Assessment: Family meeting held with patient's  sons, and DIL.  Her son Jennifer Zavala, is her healthcare POA.  Patient also has a daughter Jennifer Zavala who resides in [place] who was on speaker phone for goals of care discussion.   Jennifer Zavala is flying in this evening to see her mother.  We addressed multiple questions in the setting of an acute stroke, with its associated uncertainty in terms of  to what, if any, degree she will be able to recover.  After meeting with Dr. Wynelle Cleveland on 10/14/2017, family elected DNR status.  They are struggling with other issues commonly seen with patients and families at end of life principally being nutrition.  We began discussions regarding the risks and benefits of artificial nutrition including  temporary measures as a core track.  We also began discussions as to next steps in the setting of this acute Jennifer such as nursing home for rehab or if she is trending towards end of life and is residential hospice.  Recommendations/Plan:  Continue with DNR/DNI  Daughter wishes for decisions regarding artificial nutrition, even core tract, to be delayed until we can meet as a family again on 10/16/2017 at 10am, where we will review again risks/benefits of feeding tubes specifically in the setting of acute CVA in a 83 year old, underlying A. fib  Goals of Care and Additional Recommendations:  Limitations on Scope of Treatment: Avoid Hospitalization, No Chemotherapy, No Radiation, No Surgical Procedures and No Tracheostomy  Code Status:    Code Status Orders  (From admission, onward)        Start     Ordered   10/14/17 1414  Do not attempt resuscitation (DNR)  Continuous    Question Answer Comment  In the Jennifer of cardiac or respiratory ARREST Do not call a "code blue"   In the Jennifer of cardiac or respiratory ARREST Do not perform Intubation, CPR, defibrillation or ACLS   In the Jennifer of cardiac or respiratory ARREST Use medication by any route, position, wound care, and other measures to relive pain and suffering. May use oxygen, suction and manual treatment of airway obstruction as needed for comfort.      10/14/17 1413    Code Status History    Date Active Date Inactive Code Status Order ID  Comments User Context   10/14/2017 01:31 10/14/2017 14:13 Partial Code [id number]  Barton Dubois, MD Inpatient   10/13/2017 16:39 10/14/2017 01:30 Partial Code [id number]  Gareth Morgan, MD Jennifer Zavala   10/13/2017 16:39 10/13/2017 16:39 DNR [id number]  Gareth Morgan, MD Jennifer Zavala   07/23/2012 19:49 07/25/2012 13:58 DNR [id number]  Laural Benes, RN Inpatient   07/21/2012 01:20 07/22/2012 19:53 DNR [id number]  Marisa Cyphers, RN Jennifer Zavala       Prognosis:   Unable to determine  Discharge Planning:  To Be Determined  Care plan was discussed with bedside RN  Thank you for allowing the Palliative Medicine Team to assist in the care of this patient.   Time In: 1000 Time Out: 1115 Total Time 75 min Prolonged Time Billed  yes       Greater than 50%  of this time was spent counseling and coordinating care related to the above assessment and plan.  Dory Horn, NP  Please contact Palliative Medicine Team phone at [phone] for questions and concerns.

## 2017-10-16 ENCOUNTER — Inpatient Hospital Stay (HOSPITAL_COMMUNITY): Payer: Medicare Other

## 2017-10-16 LAB — BASIC METABOLIC PANEL
ANION GAP: 17 — AB (ref 5–15)
BUN: 48 mg/dL — ABNORMAL HIGH (ref 6–20)
CALCIUM: 8.2 mg/dL — AB (ref 8.9–10.3)
CO2: 16 mmol/L — ABNORMAL LOW (ref 22–32)
Chloride: 114 mmol/L — ABNORMAL HIGH (ref 101–111)
Creatinine, Ser: 1.42 mg/dL — ABNORMAL HIGH (ref 0.44–1.00)
GFR, EST AFRICAN AMERICAN: 34 mL/min — AB (ref >60)
GFR, EST NON AFRICAN AMERICAN: 29 mL/min — AB (ref >60)
Glucose, Bld: 91 mg/dL (ref 65–99)
POTASSIUM: 3.7 mmol/L (ref 3.5–5.1)
SODIUM: 147 mmol/L — AB (ref 135–145)

## 2017-10-16 LAB — CBC
HEMATOCRIT: 38.4 % (ref 36.0–46.0)
HEMOGLOBIN: 11.8 g/dL — AB (ref 12.0–15.0)
MCH: 30.4 pg (ref 26.0–34.0)
MCHC: 30.7 g/dL (ref 30.0–36.0)
MCV: 99 fL (ref 78.0–100.0)
Platelets: 172 10*3/uL (ref 150–400)
RBC: 3.88 MIL/uL (ref 3.87–5.11)
RDW: 17.9 % — ABNORMAL HIGH (ref 11.5–15.5)
WBC: 11 10*3/uL — AB (ref 4.0–10.5)

## 2017-10-16 LAB — LACTIC ACID, PLASMA: LACTIC ACID, VENOUS: 1.5 mmol/L (ref 0.5–1.9)

## 2017-10-16 LAB — GLUCOSE, CAPILLARY: GLUCOSE-CAPILLARY: 87 mg/dL (ref 65–99)

## 2017-10-16 MED ORDER — DILTIAZEM HCL 60 MG PO TABS
60.0000 mg | ORAL_TABLET | Freq: Four times a day (QID) | ORAL | Status: DC
  Administered 2017-10-16 – 2017-10-17 (×4): 60 mg via ORAL
  Filled 2017-10-16 (×4): qty 1

## 2017-10-16 MED ORDER — DEXTROSE 5 % IV SOLN
1.0000 g | INTRAVENOUS | Status: DC
  Administered 2017-10-16 – 2017-10-18 (×3): 1 g via INTRAVENOUS
  Filled 2017-10-16 (×3): qty 10

## 2017-10-16 MED ORDER — DILTIAZEM HCL 100 MG IV SOLR
5.0000 mg/h | INTRAVENOUS | Status: DC
  Filled 2017-10-16: qty 100

## 2017-10-16 NOTE — Progress Notes (Signed)
With supervision, pt enjoyed drinking milk, able to hold cup in her hand. Medication administered successfully crushed in apple sauce.

## 2017-10-16 NOTE — Evaluation (Signed)
Clinical/Bedside Swallow Evaluation Patient Details  Name: Jennifer MiloMarguerite H Zavala MRN: [id number] Date of Birth: [birth year]  Today's Date: 10/16/2017 Time: SLP Start Time (ACUTE ONLY): 0735 SLP Stop Time (ACUTE ONLY): 0754 SLP Time Calculation (min) (ACUTE ONLY): 19 min  Past Medical History:  Past Medical History:  Diagnosis Date  . Angina at rest [facility](HCC)   . Aortic aneurysm (HCC)   . Coronary artery disease   . Hypertension   . Hypothyroid    Past Surgical History:  Past Surgical History:  Procedure Laterality Date  . ABDOMINAL HYSTERECTOMY    . BLADDER SURGERY    . CAROTID ARTERY ANGIOPLASTY    . CATARACT EXTRACTION     HPI:  Jennifer HazelMarguerite H Gibsonis a 83 year old woman with a past medical history significant for hypertension, hypothyroidism, coronary artery disease and hyperlipidemia; who presented to ED from her independent living facility due to failure to thrive and changes in mentation. Apparently patient has been refusing to do activities that she normally enjoyed and has beenable to performwithout much assistance. She has not beeneating or drinking, and is essentially confused and moaning.  She is having difficulty expressing herself at times. Patient's symptoms continue essentially progressing and becoming even more alarming and the patient was transferred to the ED for further evaluation. There has not been any fever, coughing, headaches, complaints of chest pain, shortness of breath, nausea, vomiting, hematemesis, melena, hematochezia or hematuria. At the facility they reported increase in urinary frequency.  CT head indicated that the patient had an acute RIGHT MCA infarct.  Most recent chest xray is showing bilater opacities suggestive of either edema or infection.     Assessment / Plan / Recommendation Clinical Impression  Clinical swallowing evaluation was completed in setting of new right MCA CVA.  Limited oral mechanism exam was completed.  Range of motion of the  structures appeared to be adequate.  Unable to assess strength.  Volitional cough is weak.  Oral care was completed prior to presentation of boluses and the patient's oral cavity was noted to be significant dry.   The patient presented with a possible oral and phayrngeal dyspahgia.  Given ice chips the patient consistently attempted to expectorate them but given a direction to swallow she was able to swallow the presented ice chip without obvious s/s of aspiration.  Given thin liquids via spoon and 1/2 tsp boluses of pureed material the patient was noted to have a possibly delayed oral transit and swallow trigger.  Immediate throat clear and cough were noted suggesting aspiration.  Safest route is to keep the patient NPO and proceed with MBS to determine current swallowing physiology.  Currently, goals of care are not firmly established.  ST will follow back up to determine if an MBS would be desired.  Nurse was made aware of the results.  Recommend allowing the patient to have ice chips with nursing only following oral care.   SLP Visit Diagnosis: Dysphagia, oropharyngeal phase (R13.12)    Aspiration Risk  Moderate aspiration risk    Diet Recommendation  NPO except ice chips with nursing only.     Medication Administration: Via alternative means    Other  Recommendations Oral Care Recommendations: Oral care QID   Follow up Recommendations Other (comment)(TBD)      Frequency and Duration min 2x/week  2 weeks       Prognosis Prognosis for Safe Diet Advancement: Fair      Swallow Study   General Date of Onset: 10/13/17 HPI: Jennifer MikeMarguerite H Gibsonis  a 83 year old woman with a past medical history significant for hypertension, hypothyroidism, coronary artery disease and hyperlipidemia; who presented to ED from her independent living facility due to failure to thrive and changes in mentation. Apparently patient has been refusing to do activities that she normally enjoyed and has beenable to  performwithout much assistance. She has not beeneating or drinking, and is essentially confused and moaning.  She is having difficulty expressing herself at times. Patient's symptoms continue essentially progressing and becoming even more alarming and the patient was transferred to the ED for further evaluation. There has not been any fever, coughing, headaches, complaints of chest pain, shortness of breath, nausea, vomiting, hematemesis, melena, hematochezia or hematuria. At the facility they reported increase in urinary frequency.  CT head indicated that the patient had an acute RIGHT MCA infarct.  Most recent chest xray is showing bilater opacities suggestive of either edema or infection.   Type of Study: Bedside Swallow Evaluation [facility]  Diet Prior to this Study: NPO Temperature Spikes Noted: No Respiratory Status: Non-rebreather History of Recent Intubation: No Behavior/Cognition: Alert;Cooperative Oral Cavity Assessment: Dry Oral Care Completed by SLP: Yes Oral Cavity - Dentition: Edentulous Self-Feeding Abilities: Total assist Patient Positioning: Upright in bed Baseline Vocal Quality: Low vocal intensity Volitional Cough: Weak Volitional Swallow: Able to elicit    Oral/Motor/Sensory Function Overall Oral Motor/Sensory Function: (Unable to fully assess.  ROM appeared adequate.  )   Ice Chips Ice chips: Impaired Presentation: Spoon Oral Phase Impairments: Impaired mastication Oral Phase Functional Implications: Prolonged oral transit Pharyngeal Phase Impairments: Suspected delayed Swallow   Thin Liquid Thin Liquid: Impaired Presentation: Spoon Pharyngeal  Phase Impairments: Suspected delayed Swallow;Throat Clearing - Immediate;Cough - Immediate    Nectar Thick Nectar Thick Liquid: Not tested   Honey Thick Honey Thick Liquid: Not tested   Puree Puree: Impaired Presentation: Spoon Oral Phase Impairments: Impaired mastication Oral  Phase Functional Implications: Prolonged oral transit Pharyngeal Phase Impairments: Suspected delayed Swallow;Throat Clearing - Immediate;Cough - Immediate   Solid   GO   Solid: Not tested        Dimas AguasMelissa Dann Ventress, MA, CCC-SLP Acute Rehab SLP [phone]  Fleet ContrasMelissa N Brienne Liguori 10/16/2017,8:12 AM

## 2017-10-16 NOTE — Progress Notes (Addendum)
PROGRESS NOTE    Jennifer Zavala   ZOX:[id number]  DOB: Jul 08, 1916  DOA: 10/13/2017 PCP: Johny Blamer, MD   Brief Narrative:  Jennifer Zavala  a 83 year old woman with a past medical history significant for hypertension, hypothyroidism, coronary artery disease and hyperlipidemia; who presented to ED from her independent living facility due to failure to thrive, changes in mentation including refusing to do activities that she normally enjoyed, not eating, not drinking, being essentially confused, moaning and having difficulty expressing herself at times.  CT scan of the head positive for acute right MCA in setting of new A-fib with RVR.  Lactic acid 4.13  Subjective: Very restless still. Has no complaints.    Assessment & Plan:   Principal Problem:   Acute right MCA stroke  - large stroke with high risk for hemorrhagic conversion but can have aspirin.  -   MRI cancelled as she may be more hospice appropriate and has been quite restless as well - unable to do carotid duplex as she would not cooperate with moving her arms - Lipid panel> LDL 88, HDL 38 - A1c 5.8 - ECHO - 81/19- patient's O2 level was noted to drop to the 70s and at one point she was placed on a BiPAP as 100% FiO2 was not helping. Further discussion with her POA, Soila Printup was had. She was unable to tolerare the BiPAP due to discomfort and it was decided to make her DNI and leave off the BiPAP.   - per family, they would like to be able to feed the patient and accept the risks of swallowing - will go ahead and start a diet and give meds crushed with applesauce   Active Problems: - Aifb with RVR - try to control with IV and oral Lopressor- hold off on anticoagulation for now to prevent hemorrhagic conversion of CVA - TSH normal - rate still uncontrolled- 11/30 added short acting Cardizem but have found out today that she cannot swallow-  Try switching to oral Cardizem today - obtaining ECHO  Fluid  overloaded- acute respiratory failure - started IV Lasix- continue- pulse ox is improving  Mild acidosis  - follow    HTN (hypertension) - Lopressor IV- hold Atenolol     Hypothyroidism - Synthroid    Acute lower UTI - Ceftriaxone- have reviewed Cultures- cont Rocephin    Lactic acidosis - in setting of A-fib with RVR- improving    Toxic metabolic encephalopathy - due to CVA vs UTI    Acute renal failure superimposed on stage 3 chronic kidney disease   - baseline Cr 0.8- likely due to poor cardiac output- follow    Palliative care by specialist    DVT prophylaxis: SCDs Code Status: DNR Family Communication:  Disposition Plan: follow in SDU Consultants:   Neuro  pallliative care Procedures:     Antimicrobials:  Anti-infectives (From admission, onward)   Start     Dose/Rate Route Frequency Ordered Stop   10/16/17 1530  cefTRIAXone (ROCEPHIN) 1 g in dextrose 5 % 50 mL IVPB     1 g 100 mL/hr over 30 Minutes Intravenous Every 24 hours 10/16/17 1521     10/15/17 0200  cefTRIAXone (ROCEPHIN) 2 g in dextrose 5 % 50 mL IVPB  Status:  Discontinued     2 g 100 mL/hr over 30 Minutes Intravenous Every 24 hours 10/14/17 0644 10/14/17 1633   10/14/17 0145  cefTRIAXone (ROCEPHIN) 2 g in dextrose 5 % 50 mL IVPB  2 g 100 mL/hr over 30 Minutes Intravenous  Once 10/14/17 0130 10/14/17 0242   10/13/17 1345  cefTRIAXone (ROCEPHIN) 2 g in dextrose 5 % 50 mL IVPB     2 g 100 mL/hr over 30 Minutes Intravenous  Once 10/13/17 1337 10/13/17 1510       Objective: Vitals:   10/15/17 2324 10/16/17 0309 10/16/17 0800 10/16/17 1200  BP: 128/76 111/60 115/70 112/80  Pulse: (!) 102 88  93  Resp: 13 17  13   Temp: (!) 96.7 F (35.9 C) (!) 97.1 F (36.2 C) (!) 97.1 F (36.2 C) (!) 97.4 F (36.3 C)  TempSrc: Axillary Axillary Axillary Axillary  SpO2: 99% 95% 98% 97%  Weight:      Height:        Intake/Output Summary (Last 24 hours) at 10/16/2017 1523 Last data filed at  10/16/2017 1300 Gross per 24 hour  Intake 293.92 ml  Output 90 ml  Net 203.92 ml   Filed Weights   10/14/17 0117  Weight: 63 kg (139 lb)    Examination: General exam: Appears comfortable  HEENT: PERRLA, oral mucosa moist, no sclera icterus or thrush Respiratory system: Clear to auscultation. Respiratory effort normal. Cardiovascular system: S1 & S2 heard, RRR.  No murmurs  Gastrointestinal system: Abdomen soft, non-tender, nondistended. Normal bowel sound. No organomegaly Central nervous system: alert, moves arms and legs well- does not always follow command and does not precipitate with exam Extremities: No cyanosis, clubbing or edema Skin: No rashes or ulcers Psychiatry:  Cannot assess    Data Reviewed: I have personally reviewed following labs and imaging studies  CBC: Recent Labs  Lab 10/13/17 1243 10/14/17 0226 10/15/17 0303 10/16/17 0615  WBC 8.8 12.9* 11.1* 11.0*  NEUTROABS 7.3  --   --   --   HGB 12.0 11.5* 12.5 11.8*  HCT 36.8 36.6 38.3 38.4  MCV 97.4 98.7 98.5 99.0  PLT 289 232 187 172   Basic Metabolic Panel: Recent Labs  Lab 10/13/17 1243 10/14/17 0226 10/15/17 0303 10/16/17 0615  NA 139 141 145 147*  K 4.6 4.8 4.2 3.7  CL 106 113* 115* 114*  CO2 19* 16* 16* 16*  GLUCOSE 158* 122* 72 91  BUN 58* 53* 52* 48*  CREATININE 1.55* 1.58* 1.54* 1.42*  CALCIUM 9.0 8.3* 8.0* 8.2*   GFR: Estimated Creatinine Clearance: 20.2 mL/min (A) (by C-G formula based on SCr of 1.42 mg/dL (H)). Liver Function Tests: Recent Labs  Lab 10/13/17 1243  AST 64*  ALT 72*  ALKPHOS 111  BILITOT 1.3*  PROT 7.7  ALBUMIN 4.0   No results for input(s): LIPASE, AMYLASE in the last 168 hours. No results for input(s): AMMONIA in the last 168 hours. Coagulation Profile: No results for input(s): INR, PROTIME in the last 168 hours. Cardiac Enzymes: No results for input(s): CKTOTAL, CKMB, CKMBINDEX, TROPONINI in the last 168 hours. BNP (last 3 results) No results for  input(s): PROBNP in the last 8760 hours. HbA1C: Recent Labs    10/14/17 0226  HGBA1C 5.8*   CBG: Recent Labs  Lab 10/16/17 0557  GLUCAP 87   Lipid Profile: Recent Labs    10/14/17 0226  CHOL 145  HDL 34*  LDLCALC 88  TRIG 540  CHOLHDL 4.3   Thyroid Function Tests: Recent Labs    10/13/17 2037  TSH 2.146   Anemia Panel: No results for input(s): VITAMINB12, FOLATE, FERRITIN, TIBC, IRON, RETICCTPCT in the last 72 hours. Urine analysis:    Component Value  Date/Time   COLORURINE AMBER (A) 10/13/2017 1245   APPEARANCEUR HAZY (A) 10/13/2017 1245   LABSPEC 1.018 10/13/2017 1245   PHURINE 5.0 10/13/2017 1245   GLUCOSEU NEGATIVE 10/13/2017 1245   HGBUR NEGATIVE 10/13/2017 1245   BILIRUBINUR NEGATIVE 10/13/2017 1245   KETONESUR 5 (A) 10/13/2017 1245   PROTEINUR 100 (A) 10/13/2017 1245   NITRITE NEGATIVE 10/13/2017 1245   LEUKOCYTESUR NEGATIVE 10/13/2017 1245   Sepsis Labs: @LABRCNTIP (procalcitonin:4,lacticidven:4) ) Recent Results (from the past 240 hour(s))  Blood Culture (routine x 2)     Status: None (Preliminary result)   Collection Time: 10/13/17 12:35 PM  Result Value Ref Range Status   Specimen Description BLOOD LEFT ANTECUBITAL  Final   Special Requests   Final    BOTTLES DRAWN AEROBIC AND ANAEROBIC Blood Culture adequate volume   Culture   Final    NO GROWTH 3 DAYS Performed at [facility], [address] [place], [place] [address]    Report Status PENDING  Incomplete  Urine culture     Status: Abnormal   Collection Time: 10/13/17 12:45 PM  Result Value Ref Range Status   Specimen Description URINE, CATHETERIZED  Final   Special Requests Normal  Final   Culture 30,000 COLONIES/mL ESCHERICHIA COLI (A)  Final   Report Status 10/15/2017 FINAL  Final   Organism ID, Bacteria ESCHERICHIA COLI (A)  Final      Susceptibility   Escherichia coli - MIC*    AMPICILLIN 4 SENSITIVE Sensitive     CEFAZOLIN <=4 SENSITIVE Sensitive     CEFTRIAXONE <=1  SENSITIVE Sensitive     CIPROFLOXACIN <=0.25 SENSITIVE Sensitive     GENTAMICIN <=1 SENSITIVE Sensitive     IMIPENEM <=0.25 SENSITIVE Sensitive     NITROFURANTOIN 64 INTERMEDIATE Intermediate     TRIMETH/SULFA <=20 SENSITIVE Sensitive     AMPICILLIN/SULBACTAM <=2 SENSITIVE Sensitive     PIP/TAZO <=4 SENSITIVE Sensitive     Extended ESBL NEGATIVE Sensitive     * 30,000 COLONIES/mL ESCHERICHIA COLI  Blood Culture (routine x 2)     Status: None (Preliminary result)   Collection Time: 10/13/17  2:30 PM  Result Value Ref Range Status   Specimen Description BLOOD RIGHT ANTECUBITAL  Final   Special Requests   Final    BOTTLES DRAWN AEROBIC AND ANAEROBIC Blood Culture adequate volume   Culture   Final    NO GROWTH 3 DAYS Performed at [facility], [address] [place], [place] [address]    Report Status PENDING  Incomplete  MRSA PCR Screening     Status: None   Collection Time: 10/14/17  1:30 AM  Result Value Ref Range Status   MRSA by PCR NEGATIVE NEGATIVE Final    Comment:        The GeneXpert MRSA Assay (FDA approved for NASAL specimens only), is one component of a comprehensive MRSA colonization surveillance program. It is not intended to diagnose MRSA infection nor to guide or monitor treatment for MRSA infections.          Radiology Studies: Dg Chest Port 1 View  Result Date: 10/16/2017 CLINICAL DATA:  Hypoxia EXAM: PORTABLE CHEST 1 VIEW COMPARISON:  10/15/2017 FINDINGS: Cardiomegaly with vascular congestion. Improving airspace opacities. Mild residual scratched at residual left perihilar and lower lobe airspace opacity concerning for pneumonia. Small bilateral effusions. IMPRESSION: Improving aeration in bilateral airspace opacities. Residual left lower lobe consolidation. Small effusions. Electronically Signed   By: Charlett NoseKevin  Dover M.D.  On: 10/16/2017 07:52   Dg Chest Port 1 View  Result Date: 10/15/2017 CLINICAL DATA:  Hypoxia EXAM: PORTABLE CHEST 1 VIEW  COMPARISON:  None. FINDINGS: Cardiomegaly with vascular congestion. Bilateral lower lobe airspace opacities, left greater than right could reflect asymmetric edema or infection. Suspect small left effusion. No acute bony abnormality. IMPRESSION: Bilateral airspace opacities, left greater than right which could reflect asymmetric edema or infection. Small left effusion. Electronically Signed   By: Charlett NoseKevin  Dover M.D.   On: 10/15/2017 08:54      Scheduled Meds: . diltiazem  60 mg Oral Q6H  . furosemide  40 mg Intravenous BID  . levothyroxine  25 mcg Intravenous Daily  . metoprolol tartrate  25 mg Oral BID  . pantoprazole (PROTONIX) IV  40 mg Intravenous Q24H   Continuous Infusions: . cefTRIAXone (ROCEPHIN)  IV    . diltiazem (CARDIZEM) infusion 10 mg/hr (10/16/17 1200)     LOS: 3 days    Time spent in minutes: 45    Calvert CantorSaima Phuong Hillary, MD [facility] Pager: [email or web address] Password [email or web address] 10/16/2017, 3:23 PM

## 2017-10-16 NOTE — Progress Notes (Signed)
Daily Progress Note   Patient Name: Jennifer Zavala       Date: 10/16/2017 DOB: 23-Nov-1915  Age: 83 y.o. MRN#: [id number] Attending Physician: Debbe Odea, MD Primary Care Physician: Shirline Frees, MD Admit Date: 10/13/2017  Reason for Consultation/Follow-up: Establishing goals of care and Psychosocial/spiritual support  Subjective: Met with patient, patient's sons, daughter-in-law's as well as daughter who flew in from [place].  Patient is sitting in a chair this morning.  She has been seen by speech services who recommended a modified barium swallow for more information and in the interim ice chips.  Patient is calling for water and seems to be very thirsty. She is also on a Cardizem drip. She comes notably more anxious and agitated with talking, drinking, with associated dropping O2 sats she has had 3 doses of IV morphine 2 mg in the past 24 hours to help with dyspnea and has tolerated the medicine but she herself does not want to take it  Length of Stay: 3  Current Medications: Scheduled Meds:  . furosemide  40 mg Intravenous BID  . levothyroxine  25 mcg Intravenous Daily  . metoprolol tartrate  25 mg Oral BID  . pantoprazole (PROTONIX) IV  40 mg Intravenous Q24H    Continuous Infusions: . diltiazem (CARDIZEM) infusion 10 mg/hr (10/16/17 0900)    PRN Meds: acetaminophen **OR** acetaminophen (TYLENOL) oral liquid 160 mg/5 mL **OR** acetaminophen, feeding supplement (ENSURE ENLIVE), metoprolol tartrate, morphine injection, senna-docusate  Physical Exam  Constitutional:  Frail, elderly female: She is in a recliner, taking sips of liquid and ice chips  HENT:  Head: Atraumatic.  Neck: Normal range of motion.  Cardiovascular:  On Cardizem drip  Pulmonary/Chest:  Creased  work of breathing noted with conversation as well as drinking  Neurological: She is alert.  Recognizes her family is able to recall their names  Psychiatric:  Anxious, agitated at times when awake  Nursing note and vitals reviewed.           Vital Signs: BP 115/70 (BP Location: Right Arm)   Pulse 88   Temp (!) 97.1 F (36.2 C) (Axillary)   Resp 17   Ht _0  (1.676 m)   Wt 63 kg (139 lb)   SpO2 98%   BMI 22.44 kg/m  SpO2: SpO2: 98 % O2 Device: O2  Device: Nasal Cannula O2 Flow Rate: O2 Flow Rate (L/min): 6 L/min  Intake/output summary:   Intake/Output Summary (Last 24 hours) at 10/16/2017 1230 Last data filed at 10/16/2017 0900 Gross per 24 hour  Intake 293.92 ml  Output 90 ml  Net 203.92 ml   LBM: Last BM Date: 10/12/17 Baseline Weight: Weight: 63 kg (139 lb) Most recent weight: Weight: 63 kg (139 lb)       Palliative Assessment/Data:    Flowsheet Rows     Most Recent Value  Intake Tab  Referral Department  Hospitalist  Unit at Time of Referral  Intermediate Care Unit  Palliative Care Primary Diagnosis  Neurology  Date Notified  10/13/17  Palliative Care Type  New Palliative care  Reason for referral  Clarify Goals of Care  Date of Admission  10/13/17  Date first seen by Palliative Care  10/14/17  # of days Palliative referral response time  1 Day(s)  # of days IP prior to Palliative referral  0  Clinical Assessment  Palliative Performance Scale Score  30%  Pain Max last 24 hours  Not able to report  Pain Min Last 24 hours  Not able to report  Dyspnea Max Last 24 Hours  Not able to report  Dyspnea Min Last 24 hours  Not able to report  Nausea Max Last 24 Hours  Not able to report  Nausea Min Last 24 Hours  Not able to report  Anxiety Max Last 24 Hours  Not able to report  Anxiety Min Last 24 Hours  Not able to report  Other Max Last 24 Hours  Not able to report  Psychosocial & Spiritual Assessment  Palliative Care Outcomes  Patient/Family meeting held?   No  Palliative Care follow-up planned  Yes, Facility      Patient Active Problem List   Diagnosis Date Noted  . Acute kidney injury ([place])   . Atrial fibrillation with RVR ([place])   . Palliative care by specialist   . Acute right MCA stroke ([place]) 10/13/2017  . Acute lower UTI 10/13/2017  . Lactic acidosis 10/13/2017  . Toxic metabolic encephalopathy 58/85/0277  . Acute renal failure superimposed on stage 3 chronic kidney disease ([place]) 10/13/2017  . Fracture of left humerus 07/21/2012  . HTN (hypertension) 07/21/2012  . Hypothyroidism 07/21/2012  . Aortic aneurysm ([place]) 07/21/2012    Palliative Care Assessment & Plan   Patient Profile: 83 y.o.femalewith past medical history of hypertension, hyperlipidemia, coronary artery diseaseadmitted on 11/29/2018with decreased oral intake, altered mental status. Per CT scan, patient was found to have an acute MCA stroke in the setting of new a fib. Per neurology's note, patient is a high risk for hemorrhagic conversion.  Consult ordered for GOC   Assessment: As noted above, met with patient's children today, we did revisit feeding and hydration, in the setting of aspiration risk, advanced age, stroke.  We discussed trajectory at this point in pursuing either skilled nursing facility for rehab or preparing family that this also could be end-of-life that she may qualify for residential hospice services.  Discussed in detail what those services can offer and what they cannot.  Family is struggling with not knowing what to expect, "every day is different with her". They all articulate that she would not want to live in a nursing home and are very distressed that this may not be potentially what she is dealing with.  They also are excepting that she may be at end of life.  Family is not ready for full comfort care yet such as stopping Cardizem drip.  But they have made the decision for no artificial feeding including even temporary core track  as well as PEG I believe Mrs. Kohls physical condition will declare itself in the next several days as to which trajectory she is on, be it end of life care and residential hospice or skilled nursing for rehab.  Family verbalized understanding.  Recommendations/Plan:  DNR/DNI  No artificial feeding including temporary feeding via core track  Family would like diet recommendation for the safest diet possible knowing that she still has aspiration risk.  They would not be opposed to a modified barium swallow test if this was needed in order to advise on diet  Continue with Cardizem drip and management of atrial fib for now  Palliative medicine to meet with family again on 10/17/2017 at 10 AM to update clinical status and see where patient is in terms of dyspnea/A. fib with RVR  No BiPAP but may use 100% nonrebreather if patient tolerates  Goals of Care and Additional Recommendations:  Limitations on Scope of Treatment: Initiate Comfort Feeding, No Artificial Feeding, No Chemotherapy, No Hemodialysis, No Radiation, No Surgical Procedures and No Tracheostomy  Code Status:    Code Status Orders  (From admission, onward)        Start     Ordered   10/14/17 1414  Do not attempt resuscitation (DNR)  Continuous    Question Answer Comment  In the event of cardiac or respiratory ARREST Do not call a "code blue"   In the event of cardiac or respiratory ARREST Do not perform Intubation, CPR, defibrillation or ACLS   In the event of cardiac or respiratory ARREST Use medication by any route, position, wound care, and other measures to relive pain and suffering. May use oxygen, suction and manual treatment of airway obstruction as needed for comfort.      10/14/17 1413    Code Status History    Date Active Date Inactive Code Status Order ID Comments User Context   10/14/2017 01:31 10/14/2017 14:13 Partial Code [id number]  Barton Dubois, MD Inpatient   10/13/2017 16:39 10/14/2017 01:30  Partial Code [id number]  Gareth Morgan, MD ED   10/13/2017 16:39 10/13/2017 16:39 DNR [id number]  Gareth Morgan, MD ED   07/23/2012 19:49 07/25/2012 13:58 DNR [id number]  Laural Benes, RN Inpatient   07/21/2012 01:20 07/22/2012 19:53 DNR [id number]  Marisa Cyphers, RN ED       Prognosis:   Unable to determine  Discharge Planning:  To Be Determined   Thank you for allowing the Palliative Medicine Team to assist in the care of this patient.   Time In: 1000 Time Out: 1045 Total Time 45 min Prolonged Time Billed  no       Greater than 50%  of this time was spent counseling and coordinating care related to the above assessment and plan.  Dory Horn, NP  Please contact Palliative Medicine Team phone at [phone] for questions and concerns.

## 2017-10-17 ENCOUNTER — Inpatient Hospital Stay (HOSPITAL_COMMUNITY): Payer: Medicare Other

## 2017-10-17 DIAGNOSIS — E785 Hyperlipidemia, unspecified: Secondary | ICD-10-CM

## 2017-10-17 DIAGNOSIS — R1310 Dysphagia, unspecified: Secondary | ICD-10-CM

## 2017-10-17 DIAGNOSIS — Z7189 Other specified counseling: Secondary | ICD-10-CM

## 2017-10-17 DIAGNOSIS — R1312 Dysphagia, oropharyngeal phase: Secondary | ICD-10-CM

## 2017-10-17 DIAGNOSIS — I361 Nonrheumatic tricuspid (valve) insufficiency: Secondary | ICD-10-CM

## 2017-10-17 DIAGNOSIS — I63511 Cerebral infarction due to unspecified occlusion or stenosis of right middle cerebral artery: Secondary | ICD-10-CM

## 2017-10-17 LAB — BASIC METABOLIC PANEL
Anion gap: 15 (ref 5–15)
BUN: 45 mg/dL — AB (ref 6–20)
CO2: 23 mmol/L (ref 22–32)
CREATININE: 1.32 mg/dL — AB (ref 0.44–1.00)
Calcium: 8.9 mg/dL (ref 8.9–10.3)
Chloride: 113 mmol/L — ABNORMAL HIGH (ref 101–111)
GFR, EST AFRICAN AMERICAN: 37 mL/min — AB (ref >60)
GFR, EST NON AFRICAN AMERICAN: 32 mL/min — AB (ref >60)
Glucose, Bld: 117 mg/dL — ABNORMAL HIGH (ref 65–99)
POTASSIUM: 3.1 mmol/L — AB (ref 3.5–5.1)
SODIUM: 151 mmol/L — AB (ref 135–145)

## 2017-10-17 LAB — ECHOCARDIOGRAM COMPLETE
Height: 66 in
WEIGHTICAEL: 2224 [oz_av]

## 2017-10-17 MED ORDER — PRAVASTATIN SODIUM 40 MG PO TABS
40.0000 mg | ORAL_TABLET | Freq: Every day | ORAL | Status: DC
  Administered 2017-10-17 – 2017-10-18 (×2): 40 mg via ORAL
  Filled 2017-10-17 (×2): qty 1

## 2017-10-17 MED ORDER — DILTIAZEM HCL ER COATED BEADS 240 MG PO CP24
240.0000 mg | ORAL_CAPSULE | Freq: Every day | ORAL | Status: DC
  Administered 2017-10-17: 240 mg via ORAL
  Filled 2017-10-17: qty 1

## 2017-10-17 MED ORDER — ASPIRIN EC 325 MG PO TBEC
325.0000 mg | DELAYED_RELEASE_TABLET | Freq: Every day | ORAL | Status: DC
  Filled 2017-10-17: qty 1

## 2017-10-17 MED ORDER — RESOURCE THICKENUP CLEAR PO POWD
ORAL | Status: DC | PRN
  Filled 2017-10-17: qty 125

## 2017-10-17 MED ORDER — POTASSIUM CHLORIDE CRYS ER 20 MEQ PO TBCR
40.0000 meq | EXTENDED_RELEASE_TABLET | ORAL | Status: AC
  Administered 2017-10-17 (×2): 40 meq via ORAL
  Filled 2017-10-17 (×2): qty 2

## 2017-10-17 MED ORDER — ASPIRIN 325 MG PO TABS
325.0000 mg | ORAL_TABLET | Freq: Every day | ORAL | Status: DC
  Administered 2017-10-17 – 2017-10-18 (×2): 325 mg via ORAL
  Filled 2017-10-17 (×2): qty 1

## 2017-10-17 MED ORDER — ASPIRIN 300 MG RE SUPP: 300.0000 mg | Freq: Every day | RECTAL | Status: DC

## 2017-10-17 MED ORDER — ENOXAPARIN SODIUM 30 MG/0.3ML ~~LOC~~ SOLN
30.0000 mg | SUBCUTANEOUS | Status: DC
  Administered 2017-10-17 – 2017-10-18 (×2): 30 mg via SUBCUTANEOUS
  Filled 2017-10-17 (×2): qty 0.3

## 2017-10-17 NOTE — Progress Notes (Signed)
  Echocardiogram 2D Echocardiogram has been performed.  Jennifer Zavala, Jennifer Zavala 10/17/2017, 4:20 PM

## 2017-10-17 NOTE — Progress Notes (Signed)
  Speech Language Pathology Treatment: Dysphagia  Patient Details Name: Jennifer Zavala MRN: [id number] DOB: May 10, 1916 Today's Date: 10/17/2017 Time: 0916-1009 SLP Time Calculation (min) (ACUTE ONLY): 53 min  Assessment / Plan / Recommendation Clinical Impression  SLP spent quite a lot of time with pt observing with po's and educating, updating family when they arrived. She appears to be aspirating thin liquids indicated by immediate cough following 90% of trials. Consumed grits with adequate transit; did not wish to eat additional trials. Pt wear dentures with all meals which are not present in hospital; SLP and family in agreement that she is unable to masticate regular texture. Educated family OZ:HYQMVHQre:options for po's 1) comfort texture and liquids with no restrictions, 2) allowing pt to have thin water (pt frequently requests water) and thicken all other liquids to nectar. Initially, family members had differing opinions. It was agree upon to allow thin water only and thicken other drinks with puree texture until family brings dentures and food texture can be reassessed.    HPI HPI: Jennifer MikeMarguerite H Gibsonis a 83 year old woman with a past medical history significant for hypertension, hypothyroidism, coronary artery disease and hyperlipidemia; who presented to ED from her independent living facility due to failure to thrive and changes in mentation. Apparently patient has been refusing to do activities that she normally enjoyed and has beenable to performwithout much assistance. She has not beeneating or drinking, and is essentially confused and moaning.  She is having difficulty expressing herself at times. Patient's symptoms continue essentially progressing and becoming even more alarming and the patient was transferred to the ED for further evaluation. There has not been any fever, coughing, headaches, complaints of chest pain, shortness of breath, nausea, vomiting, hematemesis, melena,  hematochezia or hematuria. At the facility they reported increase in urinary frequency.  CT head indicated that the patient had an acute RIGHT MCA infarct.  Most recent chest xray is showing bilater opacities suggestive of either edema or infection.        SLP Plan  Continue with current plan of care       Recommendations  Diet recommendations: Dysphagia 1 (puree);Nectar-thick liquid(may have thin water) Liquids provided via: Cup;No straw Medication Administration: Crushed with puree Supervision: Staff to assist with self feeding;Full supervision/cueing for compensatory strategies Compensations: Minimize environmental distractions;Slow rate;Small sips/bites;Lingual sweep for clearance of pocketing Postural Changes and/or Swallow Maneuvers: Seated upright 90 degrees                Oral Care Recommendations: Oral care BID Follow up Recommendations: None SLP Visit Diagnosis: Dysphagia, pharyngeal phase (R13.13) Plan: Continue with current plan of care       GO                Jennifer Zavala, Jennifer Zavala 10/17/2017, 10:20 AM  Jennifer Zavala M.Ed [employer]SLP Pager [phone]

## 2017-10-17 NOTE — Progress Notes (Signed)
Pharmacy Consult Note - Transition to long-acting diltiazem   89 yof admitted with acute R MCA stroke, afib. Currently on short-acting diltiazem 60mg  PO q6h (last dose 12/3 at 1330) and able to swallow per MD note. Pharmacy consulted to transition to long-acting diltiazem. BP has been WNL 110-120s/70-80s, HR 80-100s in afib.  Plan: D/c diltiazem 60mg  PO q6h Transition to diltiazem ER 240mg  PO daily and monitor clinical response, BP/HR closely for any dosage adjustments per MD   Babs BertinHaley Kourtnie Sachs, PharmD, BCPS Clinical Pharmacist Rx Phone # for today: [phone] After 3:30PM, please call Main Rx: [phone] 10/17/2017 2:27 PM

## 2017-10-17 NOTE — [place] FL2 (Signed)
[place] MEDICAID FL2 LEVEL OF CARE SCREENING TOOL     IDENTIFICATION  Patient Name: Jennifer Zavala Birthdate: 10-12-1916 Sex: female Admission Date (Current Location): 10/13/2017  [facility] [place]   Facility and Address:  [facility], [address], [place], [place] [address]      Provider Number: [id number]  Attending Physician Name and Address:  Calvert Cantorizwan, Saima, MD  Relative Name and Phone Number:  Herbert PunScott Ng, [phone]    Current Level of Care: Hospital Recommended Level of Care: Skilled Nursing Facility Prior Approval Number:    Date Approved/Denied:   PASRR Number: [id number]  Discharge Plan: SNF    Current Diagnoses: Patient Active Problem List   Diagnosis Date Noted  . Dysphagia   . Counseling regarding advanced care planning and goals of care   . Acute kidney injury ([facility])   . Atrial fibrillation with RVR ([facility])   . Palliative care by specialist   . Acute right MCA stroke ([facility]) 10/13/2017  . Acute lower UTI 10/13/2017  . Lactic acidosis 10/13/2017  . Toxic metabolic encephalopathy 10/13/2017  . Acute renal failure superimposed on stage 3 chronic kidney disease ([facility]) 10/13/2017  . Fracture of left humerus 07/21/2012  . HTN (hypertension) 07/21/2012  . Hypothyroidism 07/21/2012  . Aortic aneurysm ([facility]) 07/21/2012    Orientation RESPIRATION BLADDER Height & Weight     Self, Time, Situation, Place  Nasal Cannula Incontinent Weight: 139 lb (63 kg) Height:  5\' 6"  (167.6 cm)  BEHAVIORAL SYMPTOMS/MOOD NEUROLOGICAL BOWEL NUTRITION STATUS      Continent Diet(Dysphagia 1)  AMBULATORY STATUS COMMUNICATION OF NEEDS Skin   Extensive Assist Verbally PU Stage and Appropriate Care PU Stage 1 Dressing: (PRN)                     Personal Care Assistance Level of Assistance  Bathing, Feeding, Dressing Bathing Assistance: Limited assistance Feeding assistance: Limited assistance Dressing Assistance:  Limited assistance     Functional Limitations Info  Sight, Hearing, Speech Sight Info: Adequate Hearing Info: Adequate Speech Info: Adequate    SPECIAL CARE FACTORS FREQUENCY  Speech therapy, PT (By licensed PT)     PT Frequency: 3x week       Speech Therapy Frequency: 2x week      Contractures Contractures Info: Not present    Additional Factors Info  Code Status, Allergies Code Status Info: DNR Allergies Info: SULFA ANTIBIOTICS            Current Medications (10/17/2017):  This is the current hospital active medication list Current Facility-Administered Medications  Medication Dose Route Frequency Provider Last Rate Last Dose  . acetaminophen (TYLENOL) tablet 650 mg  650 mg Oral Q4H PRN Vassie LollMadera, Carlos, MD       Or  . acetaminophen (TYLENOL) solution 650 mg  650 mg Per Tube Q4H PRN Vassie LollMadera, Carlos, MD       Or  . acetaminophen (TYLENOL) suppository 650 mg  650 mg Rectal Q4H PRN Vassie LollMadera, Carlos, MD   650 mg at 10/14/17 0402  . cefTRIAXone (ROCEPHIN) 1 g in dextrose 5 % 50 mL IVPB  1 g Intravenous Q24H Calvert Cantorizwan, Saima, MD   Stopped at 10/16/17 1810  . diltiazem (CARDIZEM) tablet 60 mg  60 mg Oral Q6H Calvert Cantorizwan, Saima, MD   60 mg at 10/17/17 [id number]  . feeding supplement (ENSURE ENLIVE) (ENSURE ENLIVE) liquid 237 mL  237 mL Oral BID PRN Calvert Cantorizwan, Saima, MD      .  levothyroxine (SYNTHROID, LEVOTHROID) injection 25 mcg  25 mcg Intravenous Daily Vassie LollMadera, Carlos, MD   25 mcg at 10/17/17 [phone]  . metoprolol tartrate (LOPRESSOR) injection 2.5 mg  2.5 mg Intravenous Q6H PRN Calvert Cantorizwan, Saima, MD   2.5 mg at 10/15/17 0519  . metoprolol tartrate (LOPRESSOR) tablet 25 mg  25 mg Oral BID Calvert Cantorizwan, Saima, MD   25 mg at 10/17/17 1010  . morphine 2 MG/ML injection 2 mg  2 mg Intravenous Q1H PRN Calvert Cantorizwan, Saima, MD   2 mg at 10/15/17 2328  . pantoprazole (PROTONIX) injection 40 mg  40 mg Intravenous Q24H Vassie LollMadera, Carlos, MD   40 mg at 10/17/17 0906  . potassium chloride SA (K-DUR,KLOR-CON) CR tablet 40 mEq  40  mEq Oral Q4H Calvert Cantorizwan, Saima, MD   40 mEq at 10/17/17 1009  . RESOURCE THICKENUP CLEAR   Oral PRN Calvert Cantorizwan, Saima, MD      . senna-docusate (Senokot-S) tablet 1 tablet  1 tablet Oral QHS PRN Vassie LollMadera, Carlos, MD         Discharge Medications: Please see discharge summary for a list of discharge medications.  Relevant Imaging Results:  Relevant Lab Results:   Additional Information Ss#: [phone]  Jennifer MoorePatricia V Chauna Osoria, LCSW

## 2017-10-17 NOTE — Progress Notes (Signed)
PROGRESS NOTE    Jennifer Zavala   ZHY:[id number]  DOB: [birth year]  DOA: 10/13/2017 PCP: Johny Blamer, MD   Brief Narrative:  Jennifer Zavala  a 83 year old woman with a past medical history significant for hypertension, hypothyroidism, coronary artery disease and hyperlipidemia; who presented to ED from her independent living facility due to failure to thrive, changes in mentation including refusing to do activities that she normally enjoyed, not eating, not drinking, being essentially confused, moaning and having difficulty expressing herself at times.  CT scan of the head positive for acute right MCA in setting of new A-fib with RVR.  Lactic acid 4.13  Subjective: More alert and oriented now. No complaints.    Assessment & Plan:   Principal Problem:   Acute right MCA stroke  - large stroke with high risk for hemorrhagic conversion but can have aspirin.  -   MRI cancelled as she may be more hospice appropriate and has been quite restless as well - unable to do carotid duplex as she would not cooperate with moving her arms - Lipid panel> LDL 88, HDL 38- Pravastatin - A1c 5.8 - 11/30- patient's O2 level was noted to drop to the 70s and at one point she was placed on a BiPAP as 100% FiO2 was not helping. Further discussion with her POA, Sihaam Chrobak was had. She was unable to tolerare the BiPAP due to discomfort and it was decided to make her DNI and leave off the BiPAP and it appeared that she would need comfort care.   - 12/3- less resp distress- noted to have severe dysphagia with high risk for aspiration (possibly from CVA?) -  per family, they would like to be able to feed the patient and accept the risks of swallowing - will go ahead and start a diet and give meds crushed with applesauce - 12/4- tolerating pills and diet- still high risk for aspiration- family would like to continue care for her- will complete work up with ECHO, Carotid duplex - have asked neurology to  give further recommendations as well. - per neuro, start Eliquis in 5 day   Active Problems: - Aifb with RVR  - holding off on anticoagulation for now to prevent hemorrhagic conversion of CVA - TSH normal - rate still uncontrolled- 11/30 added short acting Cardizem but have found out today that she cannot swallow-  Try switching to oral Cardizem today - 12/4- rate doing reasonably well with oral Cardizem and Metoprolol now  Fluid overloaded- acute respiratory failure - started IV Lasix- she has improved significantly  - f/u ECHO  Mild acidosis  - improved     HTN (hypertension) - currently on Lopressor and Cardizem    Hypothyroidism - Synthroid    Acute lower UTI - Ceftriaxone- have reviewed Cultures- cont Rocephin for now    Lactic acidosis - in setting of A-fib with RVR- improved to normal    Toxic metabolic encephalopathy - due to CVA vs UTI- resolved     Acute renal failure superimposed on stage 3 chronic kidney disease   - baseline Cr 0.8- increase in Cr likely due to poor cardiac output due to rapid A-fib -  follow    Palliative care by specialist  - DNR- high risk for aspiration per SLP eval- will go to SNF with palliative care  DVT prophylaxis: SCDs Code Status: DNR Family Communication: sons Disposition Plan: transfer to tele Consultants:   Neuro  pallliative care Procedures:     Antimicrobials:  Anti-infectives (From admission, onward)   Start     Dose/Rate Route Frequency Ordered Stop   10/16/17 1530  cefTRIAXone (ROCEPHIN) 1 g in dextrose 5 % 50 mL IVPB     1 g 100 mL/hr over 30 Minutes Intravenous Every 24 hours 10/16/17 1521     10/15/17 0200  cefTRIAXone (ROCEPHIN) 2 g in dextrose 5 % 50 mL IVPB  Status:  Discontinued     2 g 100 mL/hr over 30 Minutes Intravenous Every 24 hours 10/14/17 0644 10/14/17 1633   10/14/17 0145  cefTRIAXone (ROCEPHIN) 2 g in dextrose 5 % 50 mL IVPB     2 g 100 mL/hr over 30 Minutes Intravenous  Once 10/14/17 0130  10/14/17 0242   10/13/17 1345  cefTRIAXone (ROCEPHIN) 2 g in dextrose 5 % 50 mL IVPB     2 g 100 mL/hr over 30 Minutes Intravenous  Once 10/13/17 1337 10/13/17 1510       Objective: Vitals:   10/17/17 0741 10/17/17 1010 10/17/17 1221 10/17/17 1329  BP: 110/68 117/76 112/70 112/70  Pulse: 92 (!) 105 81   Resp: 20  18   Temp: (!) 97.4 F (36.3 C)  (!) 97.5 F (36.4 C)   TempSrc: Axillary  Axillary   SpO2: 99%  90%   Weight:      Height:        Intake/Output Summary (Last 24 hours) at 10/17/2017 1405 Last data filed at 10/17/2017 0600 Gross per 24 hour  Intake 290 ml  Output -  Net 290 ml   Filed Weights   10/14/17 0117  Weight: 63 kg (139 lb)    Examination: General exam: Appears comfortable  HEENT: PERRLA, oral mucosa moist, no sclera icterus or thrush Respiratory system: Clear to auscultation. Respiratory effort normal. Cardiovascular system: S1 & S2 heard, IIRR.  No murmurs  Gastrointestinal system: Abdomen soft, non-tender, nondistended. Normal bowel sound. No organomegaly Central nervous system: alert, no focal neurological deficits noted on exam  Extremities: No cyanosis, clubbing or edema Skin: No rashes or ulcers Psychiatry:  Cannot assess    Data Reviewed: I have personally reviewed following labs and imaging studies  CBC: Recent Labs  Lab 10/13/17 1243 10/14/17 0226 10/15/17 0303 10/16/17 0615  WBC 8.8 12.9* 11.1* 11.0*  NEUTROABS 7.3  --   --   --   HGB 12.0 11.5* 12.5 11.8*  HCT 36.8 36.6 38.3 38.4  MCV 97.4 98.7 98.5 99.0  PLT 289 232 187 172   Basic Metabolic Panel: Recent Labs  Lab 10/13/17 1243 10/14/17 0226 10/15/17 0303 10/16/17 0615 10/17/17 0223  NA 139 141 145 147* 151*  K 4.6 4.8 4.2 3.7 3.1*  CL 106 113* 115* 114* 113*  CO2 19* 16* 16* 16* 23  GLUCOSE 158* 122* 72 91 117*  BUN 58* 53* 52* 48* 45*  CREATININE 1.55* 1.58* 1.54* 1.42* 1.32*  CALCIUM 9.0 8.3* 8.0* 8.2* 8.9   GFR: Estimated Creatinine Clearance: 21.7  mL/min (A) (by C-G formula based on SCr of 1.32 mg/dL (H)). Liver Function Tests: Recent Labs  Lab 10/13/17 1243  AST 64*  ALT 72*  ALKPHOS 111  BILITOT 1.3*  PROT 7.7  ALBUMIN 4.0   No results for input(s): LIPASE, AMYLASE in the last 168 hours. No results for input(s): AMMONIA in the last 168 hours. Coagulation Profile: No results for input(s): INR, PROTIME in the last 168 hours. Cardiac Enzymes: No results for input(s): CKTOTAL, CKMB, CKMBINDEX, TROPONINI in the last 168 hours. BNP (  last 3 results) No results for input(s): PROBNP in the last 8760 hours. HbA1C: No results for input(s): HGBA1C in the last 72 hours. CBG: Recent Labs  Lab 10/16/17 0557  GLUCAP 87   Lipid Profile: No results for input(s): CHOL, HDL, LDLCALC, TRIG, CHOLHDL, LDLDIRECT in the last 72 hours. Thyroid Function Tests: No results for input(s): TSH, T4TOTAL, FREET4, T3FREE, THYROIDAB in the last 72 hours. Anemia Panel: No results for input(s): VITAMINB12, FOLATE, FERRITIN, TIBC, IRON, RETICCTPCT in the last 72 hours. Urine analysis:    Component Value Date/Time   COLORURINE AMBER (A) 10/13/2017 1245   APPEARANCEUR HAZY (A) 10/13/2017 1245   LABSPEC 1.018 10/13/2017 1245   PHURINE 5.0 10/13/2017 1245   GLUCOSEU NEGATIVE 10/13/2017 1245   HGBUR NEGATIVE 10/13/2017 1245   BILIRUBINUR NEGATIVE 10/13/2017 1245   KETONESUR 5 (A) 10/13/2017 1245   PROTEINUR 100 (A) 10/13/2017 1245   NITRITE NEGATIVE 10/13/2017 1245   LEUKOCYTESUR NEGATIVE 10/13/2017 1245   Sepsis Labs: @LABRCNTIP (procalcitonin:4,lacticidven:4) ) Recent Results (from the past 240 hour(s))  Blood Culture (routine x 2)     Status: None (Preliminary result)   Collection Time: 10/13/17 12:35 PM  Result Value Ref Range Status   Specimen Description BLOOD LEFT ANTECUBITAL  Final   Special Requests   Final    BOTTLES DRAWN AEROBIC AND ANAEROBIC Blood Culture adequate volume   Culture   Final    NO GROWTH 4 DAYS Performed at [facility], [address] [place], [place] [address]    Report Status PENDING  Incomplete  Urine culture     Status: Abnormal   Collection Time: 10/13/17 12:45 PM  Result Value Ref Range Status   Specimen Description URINE, CATHETERIZED  Final   Special Requests Normal  Final   Culture 30,000 COLONIES/mL ESCHERICHIA COLI (A)  Final   Report Status 10/15/2017 FINAL  Final   Organism ID, Bacteria ESCHERICHIA COLI (A)  Final      Susceptibility   Escherichia coli - MIC*    AMPICILLIN 4 SENSITIVE Sensitive     CEFAZOLIN <=4 SENSITIVE Sensitive     CEFTRIAXONE <=1 SENSITIVE Sensitive     CIPROFLOXACIN <=0.25 SENSITIVE Sensitive     GENTAMICIN <=1 SENSITIVE Sensitive     IMIPENEM <=0.25 SENSITIVE Sensitive     NITROFURANTOIN 64 INTERMEDIATE Intermediate     TRIMETH/SULFA <=20 SENSITIVE Sensitive     AMPICILLIN/SULBACTAM <=2 SENSITIVE Sensitive     PIP/TAZO <=4 SENSITIVE Sensitive     Extended ESBL NEGATIVE Sensitive     * 30,000 COLONIES/mL ESCHERICHIA COLI  Blood Culture (routine x 2)     Status: None (Preliminary result)   Collection Time: 10/13/17  2:30 PM  Result Value Ref Range Status   Specimen Description BLOOD RIGHT ANTECUBITAL  Final   Special Requests   Final    BOTTLES DRAWN AEROBIC AND ANAEROBIC Blood Culture adequate volume   Culture   Final    NO GROWTH 4 DAYS Performed at [facility], [address] [place], [place] [address]    Report Status PENDING  Incomplete  MRSA PCR Screening     Status: None   Collection Time: 10/14/17  1:30 AM  Result Value Ref Range Status   MRSA by PCR NEGATIVE NEGATIVE Final    Comment:        The GeneXpert MRSA Assay (FDA approved for NASAL specimens only), is one component of a comprehensive MRSA colonization surveillance program. It is not intended to diagnose  MRSA infection nor to guide or monitor treatment for MRSA infections.          Radiology Studies: Dg Chest Port 1 View  Result Date:  10/16/2017 CLINICAL DATA:  Hypoxia EXAM: PORTABLE CHEST 1 VIEW COMPARISON:  10/15/2017 FINDINGS: Cardiomegaly with vascular congestion. Improving airspace opacities. Mild residual scratched at residual left perihilar and lower lobe airspace opacity concerning for pneumonia. Small bilateral effusions. IMPRESSION: Improving aeration in bilateral airspace opacities. Residual left lower lobe consolidation. Small effusions. Electronically Signed   By: Charlett Nose M.D.   On: 10/16/2017 07:52      Scheduled Meds: . aspirin  325 mg Oral Daily  . diltiazem  60 mg Oral Q6H  . enoxaparin (LOVENOX) injection  30 mg Subcutaneous Q24H  . levothyroxine  25 mcg Intravenous Daily  . metoprolol tartrate  25 mg Oral BID  . pantoprazole (PROTONIX) IV  40 mg Intravenous Q24H  . pravastatin  40 mg Oral Daily   Continuous Infusions: . cefTRIAXone (ROCEPHIN)  IV Stopped (10/16/17 1810)     LOS: 4 days    Time spent in minutes: 45    Calvert Cantor, MD [facility] Pager: [email or web address] Password [email or web address] 10/17/2017, 2:05 PM

## 2017-10-17 NOTE — Social Work (Signed)
CSW met with patient at bedside to see if family was available to discuss SNF options.   CSW called son [name] and left message requesting a call back to discuss.  CSW discuss patient with RN and Palliative clinical team.  CSW placed DNR on chart to be signed.  Elissa Hefty, LCSW Clinical Social Worker [phone]

## 2017-10-17 NOTE — Progress Notes (Signed)
STROKE TEAM PROGRESS NOTE   SUBJECTIVE (INTERVAL HISTORY) Patient is lying in bed, no family around, RN at bedside. Pt is trying to get out of bed, and saying " I want to get up". Pt still has mild left hemiparesis with left neglect. Palliative care on board, due to pt dramatic improvement, now family would like more aggressive management. Put back on ASA and will do MRI and MRA. CUS and TTE pending.    OBJECTIVE Lab Results: CBC:  Recent Labs  Lab 10/14/17 0226 10/15/17 0303 10/16/17 0615  WBC 12.9* 11.1* 11.0*  HGB 11.5* 12.5 11.8*  HCT 36.6 38.3 38.4  MCV 98.7 98.5 99.0  PLT 232 187 172   BMP: Recent Labs  Lab 10/13/17 1243 10/14/17 0226 10/15/17 0303 10/16/17 0615 10/17/17 0223  NA 139 141 145 147* 151*  K 4.6 4.8 4.2 3.7 3.1*  CL 106 113* 115* 114* 113*  CO2 19* 16* 16* 16* 23  GLUCOSE 158* 122* 72 91 117*  BUN 58* 53* 52* 48* 45*  CREATININE 1.55* 1.58* 1.54* 1.42* 1.32*  CALCIUM 9.0 8.3* 8.0* 8.2* 8.9   Liver Function Tests:  Recent Labs  Lab 10/13/17 1243  AST 64*  ALT 72*  ALKPHOS 111  BILITOT 1.3*  PROT 7.7  ALBUMIN 4.0   Thyroid Function Studies:  No results for input(s): TSH, T4TOTAL, T3FREE, THYROIDAB in the last 72 hours. Urinalysis:  Recent Labs  Lab 10/13/17 1245  COLORURINE AMBER*  APPEARANCEUR HAZY*  LABSPEC 1.018  PHURINE 5.0  GLUCOSEU NEGATIVE  HGBUR NEGATIVE  BILIRUBINUR NEGATIVE  KETONESUR 5*  PROTEINUR 100*  NITRITE NEGATIVE  LEUKOCYTESUR NEGATIVE    PHYSICAL EXAM Temp:  [96.8 F (36 C)-97.5 F (36.4 C)] 97.5 F (36.4 C) (12/03 1221) Pulse Rate:  [81-105] 81 (12/03 1221) Resp:  [14-20] 18 (12/03 1221) BP: (92-131)/(63-88) 112/70 (12/03 1221) SpO2:  [90 %-99 %] 90 % (12/03 1221)  General - thin built, well developed, trying to get out of bed.  Ophthalmologic - Fundi not visualized due to noncooperation.  Cardiovascular - irregularly irregular heart rate and rhythm  Neuro - awake, alert, able to follow limited  simple commands. Orientated to place, year, self and age, not orientated to hospital name, month. Left gaze preference, not cross midline, right neglect, blinking to visual threat on the right, but not to left, PERRL. No significant facial asymmetry, mild dysarthria. Tongue midline. RUE and RLE normal strength, LUE and LLE 4/5. DTR 1+ and no babinski. Sensation, coordination not cooperative and gait not tested.   IMAGING: I have personally reviewed the radiological images below and agree with the radiology interpretations.  Ct Head Wo Contrast Result Date: 10/13/2017 IMPRESSION: 1. Right posterior middle cerebral artery distribution cortically based infarct, favored to be subacute. Sulci effacement but no significant midline shift. 2.  Cerebral atrophy and small vessel ischemic change. These results were called by telephone at the time of interpretation on 10/13/2017 at 3:53 pm to Dr. Alvira MondayERIN Zavala , who verbally acknowledged these results. Electronically Signed   By: Jeronimo GreavesKyle  Zavala M.D.   On: 10/13/2017 15:55   MRI/MRA PENDING  Echocardiogram: PENDING  B/L Carotid U/S: PENDING   ASSESSMENT: Ms. Jennifer Zavala is a 83 y.o. female with PMH of HTN, HLD, Hypothyroidism admitted for confusion and failure to thrive. Noncontrast CT of the head shows a subacute right MCA territory large stroke. Her exam is exhibiting neglect of the left side and left hemiparesis along with possible left visual field cut. She  is DNR but has dramatic improvement over the last several days and now family would like more aggressive management.  STROKE: RIGHT MCA posterior division, most likely embolic from newly diagnosed afib  Resultant Symptoms: possible left hemianopsia, right gaze preference, mild dysarthria, Left sided weakness  CT head - right MCA large infarct  MRI and MRA pending  CUS pending  TTE pending  LDL 88  A1C 5.8  lovenox for DVT prophylaxis  Dysphagia 1 diet with think liquid  on  Aspirin now, will consider eliquis in 5 days due to large size of infarct.  Ongoing aggressive stroke risk factor management  Therapy recommendation - pending  Deposition - pending  Afib  Confirmed on EKG  Likely the cause of stroke  On ASA now  Will consider to start eliquis in 5 days due to large stroke  HYPERTENSION: Stable Long term BP goal normotensive.  HYPERLIPIDEMIA:  Home Meds:  Pravastatin 40 mg  LDL 88 goal < 70  Continued Pravastatin 40 mg daily  Continue statin at discharge  DYSPHAGIA:  passes SLP swallow evaluation  On diet  UTI:  On Rocephin   per Medicine team  Other Stroke Risk Factors:   Advanced age  Coronary artery disease  Hospital day # 4  Marvel PlanJindong Breauna Mazzeo, MD PhD Stroke Neurology 10/17/2017 1:42 PM   To contact Stroke Continuity provider, please refer to [email or web address]. After hours, contact General Neurology

## 2017-10-17 NOTE — Progress Notes (Signed)
Carotid duplex completed. 1% to 39% ICA stenosis bilaterally.  Vertebral artery flow is antegrade bilaterally. Caramia Boutin,RVS 10/17/2017,3:54 PM

## 2017-10-17 NOTE — Progress Notes (Signed)
Daily Progress Note   Patient Name: Jennifer Zavala       Date: 10/17/2017 DOB: [birth year]  Age: 83 y.o. MRN#: [id number] Attending Physician: Debbe Odea, MD Primary Care Physician: Shirline Frees, MD Admit Date: 10/13/2017  Reason for Consultation/Follow-up: Establishing goals of care and Psychosocial/spiritual support  Subjective: Patient awake sitting up in bed, joking with me and family. Requesting water. Holds cup and drinks with minimal assistance. Denies pain. Feels well. Oriented x4. Tells me she knows she had a stroke.   Length of Stay: 4  Current Medications: Scheduled Meds:  . diltiazem  60 mg Oral Q6H  . levothyroxine  25 mcg Intravenous Daily  . metoprolol tartrate  25 mg Oral BID  . pantoprazole (PROTONIX) IV  40 mg Intravenous Q24H  . potassium chloride  40 mEq Oral Q4H    Continuous Infusions: . cefTRIAXone (ROCEPHIN)  IV Stopped (10/16/17 1810)    PRN Meds: acetaminophen **OR** acetaminophen (TYLENOL) oral liquid 160 mg/5 mL **OR** acetaminophen, feeding supplement (ENSURE ENLIVE), metoprolol tartrate, morphine injection, senna-docusate  Physical Exam  Constitutional: She is oriented to person, place, and time. She is active. No distress.  HENT:  Head: Normocephalic and atraumatic.  Cardiovascular: An irregularly irregular rhythm present. Tachycardia present.  Pulses:      Radial pulses are 2+ on the right side, and 2+ on the left side.       Dorsalis pedis pulses are 2+ on the right side, and 2+ on the left side.  Pulmonary/Chest: Effort normal. No accessory muscle usage. No tachypnea. No respiratory distress.  Abdominal: Soft. Bowel sounds are normal.  Musculoskeletal:       Right lower leg: She exhibits no edema.       Left lower leg: She exhibits no edema.    Neurological: She is alert and oriented to person, place, and time.  Strong grip R side, will not grip with L, moves both lower extremities  Skin: Skin is warm and dry. She is not diaphoretic.  Psychiatric: Her speech is normal. Her affect is blunt.            Vital Signs: BP 110/68 (BP Location: Right Arm)   Pulse 92   Temp (!) 97.4 F (36.3 C) (Axillary)   Resp 20   Ht '5\' 6"'$  (1.676 m)   Wt 63 kg (139 lb)   SpO2 99%   BMI 22.44 kg/m  SpO2: SpO2: 99 % O2 Device: O2 Device: Nasal Cannula O2 Flow Rate: O2 Flow Rate (L/min): 6 L/min  Intake/output summary:   Intake/Output Summary (Last 24 hours) at 10/17/2017 6213 Last data filed at 10/17/2017 0600 Gross per 24 hour  Intake 290 ml  Output -  Net 290 ml   LBM: Last BM Date: 10/12/17 Baseline Weight: Weight: 63 kg (139 lb) Most recent weight: Weight: 63 kg (139 lb)       Palliative Assessment/Data: 40%    Flowsheet Rows     Most Recent Value  Intake Tab  Referral Department  Hospitalist  Unit at Time of Referral  Intermediate Care Unit  Palliative Care Primary Diagnosis  Neurology  Date Notified  10/13/17  Palliative Care Type  New Palliative care  Reason  for referral  Clarify Goals of Care  Date of Admission  10/13/17  Date first seen by Palliative Care  10/14/17  # of days Palliative referral response time  1 Day(s)  # of days IP prior to Palliative referral  0  Clinical Assessment  Palliative Performance Scale Score  30%  Pain Max last 24 hours  Not able to report  Pain Min Last 24 hours  Not able to report  Dyspnea Max Last 24 Hours  Not able to report  Dyspnea Min Last 24 hours  Not able to report  Nausea Max Last 24 Hours  Not able to report  Nausea Min Last 24 Hours  Not able to report  Anxiety Max Last 24 Hours  Not able to report  Anxiety Min Last 24 Hours  Not able to report  Other Max Last 24 Hours  Not able to report  Psychosocial & Spiritual Assessment  Palliative Care Outcomes  Patient/Family  meeting held?  No  Palliative Care follow-up planned  Yes, Facility      Patient Active Problem List   Diagnosis Date Noted  . Acute kidney injury ([place])   . Atrial fibrillation with RVR ([place])   . Palliative care by specialist   . Acute right MCA stroke ([place]) 10/13/2017  . Acute lower UTI 10/13/2017  . Lactic acidosis 10/13/2017  . Toxic metabolic encephalopathy 18/84/1660  . Acute renal failure superimposed on stage 3 chronic kidney disease ([place]) 10/13/2017  . Fracture of left humerus 07/21/2012  . HTN (hypertension) 07/21/2012  . Hypothyroidism 07/21/2012  . Aortic aneurysm ([place]) 07/21/2012    Palliative Care Assessment & Plan   HPI: 83 y.o.femalewith past medical history of hypertension, hyperlipidemia, coronary artery disease, aortic aneurysm, and hypothyroidismadmitted on 11/29/2018with decreased oral intake and altered mental status. Per CT scan, patient was found to have an acute MCA stroke in the setting of new a fib. Per neurology's note, patient is a high risk for hemorrhagic conversion. Urine culture also positive for e.coli and lab work showed lactic acidosis - pt started on rocephin. Pt presented with afib RVR and started on cardizem. Patient was independent until three weeks ago and lives at [facility] - independent living facility. On 11/30, patient's oxygen saturation dropped and she did not tolerate bipap. Family elected DNR status. Morphine has been given intermittently for dyspnea. Per SLP, patient is definitely aspirating thin liquids - modified barium swallow not needed.  Consult ordered for [place].  Assessment: Met with patient, daughter, 2 sons, and 2 DILs. They feel patient has made significant improvement. Asking about oxygen and HR - given updates on HR controlled by PO medication and oxygen being titrated down. We discussed patient's swallowing and evaluation by SLP. They understand she is at high risk for aspiration and want to try nectar thick  liquids with the exception of water - pt requests thin water. Will also start a pureed diet. If patient does not like nectar thick liquids, they would like her to have what she desires and understand that this presents an increased risk for aspiration. We discussed how high aspiration risk could lead to recurrent aspiration pneumonia and they understand. They are not interested in Hospice d/t patient's improvement in mental status and now eating - would like to move forward with rehab placement with palliative follow up.   Daughter is from [place] and if flying back tonight. Son who is Chauncey Reading is from [place] and plans to go back tomorrow.   Family eager to  speak with social work about placement options.   Recommendations/Plan:  DNR/DNI  No artificial feeding including temporary feeding tube  Nectar thickened liquids and pureed diet as patient tolerates, allow thin water - they understand aspiration risks  If patient refuses nectar thickened liquids, family requests she be given what she desires, understanding aspiration risk increases with thin liquids  Please write for palliative care follow up at rehab facility at discharge  Goals of Care and Additional Recommendations:  Limitations on Scope of Treatment: Initiate Comfort Feeding and No Artificial Feeding  Code Status:  DNR  Prognosis:   Unable to determine  Discharge Planning:  [place] for rehab with Palliative care service follow-up  Care plan was discussed with family, bedside nurse, paged Dr. Wynelle Cleveland, CSW to follow up with family   Thank you for allowing the Palliative Medicine Team to assist in the care of this patient.   Time In: 09:30 Time Out: 10:45 Total Time 75 minutes Prolonged Time Billed  yes       Greater than 50%  of this time was spent counseling and coordinating care related to the above assessment and plan.  Juel Burrow, DNP, AGNP-C Palliative Medicine Team Team Phone # [phone]

## 2017-10-18 DIAGNOSIS — J9601 Acute respiratory failure with hypoxia: Secondary | ICD-10-CM

## 2017-10-18 LAB — VAS US CAROTID
LCCAPDIAS: 14 cm/s
LCCAPSYS: 52 cm/s
LEFT ECA DIAS: -11 cm/s
LEFT VERTEBRAL DIAS: 8 cm/s
Left CCA dist dias: -15 cm/s
Left CCA dist sys: -48 cm/s
Left ICA dist dias: -20 cm/s
Left ICA dist sys: -54 cm/s
Left ICA prox dias: -14 cm/s
Left ICA prox sys: -41 cm/s
RCCADSYS: -34 cm/s
RCCAPDIAS: 11 cm/s
RCCAPSYS: 36 cm/s
RIGHT CCA MID DIAS: 15 cm/s
RIGHT ECA DIAS: -4 cm/s
RIGHT VERTEBRAL DIAS: 7 cm/s

## 2017-10-18 LAB — BASIC METABOLIC PANEL
ANION GAP: 19 — AB (ref 5–15)
BUN: 43 mg/dL — ABNORMAL HIGH (ref 6–20)
CO2: 21 mmol/L — ABNORMAL LOW (ref 22–32)
Calcium: 8.7 mg/dL — ABNORMAL LOW (ref 8.9–10.3)
Chloride: 106 mmol/L (ref 101–111)
Creatinine, Ser: 1.19 mg/dL — ABNORMAL HIGH (ref 0.44–1.00)
GFR calc Af Amer: 42 mL/min — ABNORMAL LOW (ref >60)
GFR, EST NON AFRICAN AMERICAN: 36 mL/min — AB (ref >60)
GLUCOSE: 101 mg/dL — AB (ref 65–99)
POTASSIUM: 3.7 mmol/L (ref 3.5–5.1)
Sodium: 146 mmol/L — ABNORMAL HIGH (ref 135–145)

## 2017-10-18 LAB — CULTURE, BLOOD (ROUTINE X 2)
CULTURE: NO GROWTH
Culture: NO GROWTH
SPECIAL REQUESTS: ADEQUATE
SPECIAL REQUESTS: ADEQUATE

## 2017-10-18 MED ORDER — ASPIRIN 325 MG PO TABS: 325.0000 mg | ORAL_TABLET | Freq: Every day | ORAL | Status: DC

## 2017-10-18 MED ORDER — DILTIAZEM HCL ER COATED BEADS 240 MG PO CP24: 240.0000 mg | ORAL_CAPSULE | Freq: Every day | ORAL | Status: DC

## 2017-10-18 MED ORDER — METOPROLOL TARTRATE 25 MG PO TABS: 25.0000 mg | ORAL_TABLET | Freq: Two times a day (BID) | ORAL | Status: DC

## 2017-10-18 MED ORDER — OLMESARTAN MEDOXOMIL 40 MG PO TABS: 20.0000 mg | ORAL_TABLET | Freq: Every day | ORAL | Status: DC

## 2017-10-18 MED ORDER — ENSURE ENLIVE PO LIQD
237.0000 mL | Freq: Three times a day (TID) | ORAL | Status: DC
Start: 2017-10-18 — End: 2017-10-18
  Administered 2017-10-18: 237 mL via ORAL

## 2017-10-18 NOTE — Progress Notes (Signed)
Physical Therapy Treatment Patient Details Name: Jennifer Zavala MRN: [id number] DOB: 1916-04-15 Today's Date: 10/18/2017    History of Present Illness 83 yo female admitted from independent living with CT (+) R MCA pending MRI and UTI.     PT Comments    Pt in bed upon arrival. Agreed to get up to sit in chair at beginning of session. Pt kept requesting ice cold milk and singing songs. Did not remember her married name but, was able to spell her first name. Very easily distractible and changed her mind back and forth between choices. Pt required max assist +2 for supine to sit for leg control, trunk elevation, and scoot to EOB. Max assist +2 for transfer to chair. Assist for power up and pivoting to chair. VCs for hand placement. Pt able to complete some exercises with cues for technique. Pt would benefit from CIR to improve seated balance and strength to increase functional mobility and independence.  Follow Up Recommendations  CIR     Equipment Recommendations  Other (comment)    Recommendations for Other Services       Precautions / Restrictions Precautions Precautions: Fall Restrictions Weight Bearing Restrictions: No    Mobility  Bed Mobility Overal bed mobility: Needs Assistance Bed Mobility: Supine to Sit     Supine to sit: Max assist;+2 for physical assistance     General bed mobility comments: Assist to bring legs off bed. elevate trunk, and scoot EOB. Unsure if due to weakness or desire to stay in bed and have a cup of cold milk.  Transfers Overall transfer level: Needs assistance Equipment used: 2 person hand held assist Transfers: Sit to/from UGI CorporationStand;Stand Pivot Transfers Sit to Stand: Max assist;+2 physical assistance Stand pivot transfers: Max assist;+2 physical assistance       General transfer comment: Pt able to control trunk during transfer. Max assist for support and pivot.  Ambulation/Gait                 Stairs             Wheelchair Mobility    Modified Rankin (Stroke Patients Only)       Balance Overall balance assessment: Needs assistance Sitting-balance support: Feet supported;Bilateral upper extremity supported Sitting balance-Leahy Scale: Poor Sitting balance - Comments: Pt sat EOB x 2 to 3 minutes with min guard for safety.   Standing balance support: No upper extremity supported;During functional activity Standing balance-Leahy Scale: Poor                              Cognition Arousal/Alertness: Awake/alert Behavior During Therapy: Restless Overall Cognitive Status: Difficult to assess Area of Impairment: Orientation;Following commands;Attention;Problem solving                 Orientation Level: Disoriented to;Place;Time;Situation     Following Commands: Follows one step commands inconsistently       General Comments: Pt able to state first name. Required cues to remember her married name.      Exercises General Exercises - Lower Extremity Ankle Circles/Pumps: AROM;Both;10 reps;Supine Long Arc Quad: AROM;10 reps;Seated Straight Leg Raises: AROM;Both;5 reps;Supine Hip Flexion/Marching: AROM;Both;10 reps;Seated    General Comments        Pertinent Vitals/Pain Pain Assessment: Faces Faces Pain Scale: Hurts little more Pain Intervention(s): Limited activity within patient's tolerance;Monitored during session;Repositioned    Home Living  Prior Function            PT Goals (current goals can now be found in the care plan section) Acute Rehab PT Goals Patient Stated Goal: Pt unable to state PT Goal Formulation: Patient unable to participate in goal setting Time For Goal Achievement: 10/28/17 Potential to Achieve Goals: Fair Progress towards PT goals: Progressing toward goals    Frequency    Min 3X/week      PT Plan Current plan remains appropriate    Co-evaluation              AM-PAC PT "6 Clicks"  Daily Activity  Outcome Measure  Difficulty turning over in bed (including adjusting bedclothes, sheets and blankets)?: Unable Difficulty moving from lying on back to sitting on the side of the bed? : Unable Difficulty sitting down on and standing up from a chair with arms (e.g., wheelchair, bedside commode, etc,.)?: Unable Help needed moving to and from a bed to chair (including a wheelchair)?: A Lot Help needed walking in hospital room?: Total Help needed climbing 3-5 steps with a railing? : Total 6 Click Score: 7    End of Session Equipment Utilized During Treatment: Gait belt Activity Tolerance: Other (comment);Patient limited by fatigue(limited by cognition) Patient left: in chair;with chair alarm set;with family/visitor present;with call bell/phone within reach Nurse Communication: Mobility status PT Visit Diagnosis: Other abnormalities of gait and mobility (R26.89);Muscle weakness (generalized) (M62.81);Other symptoms and signs involving the nervous system (R29.898)     Time: [id number] PT Time Calculation (min) (ACUTE ONLY): 28 min  Charges:  $Therapeutic Exercise: 8-22 mins $Therapeutic Activity: 8-22 mins                    G Codes:       Jennifer Zavala, SPTA   Jennifer Zavala 10/18/2017, 11:40 AM

## 2017-10-18 NOTE — Progress Notes (Signed)
Daily Progress Note   Patient Name: Jennifer Zavala       Date: 10/18/2017 DOB: [birth year]  Age: 83 y.o. MRN#: [id number] Attending Physician: Debbe Odea, MD Primary Care Physician: Shirline Frees, MD Admit Date: 10/13/2017  Reason for Consultation/Follow-up: Establishing goals of care and Psychosocial/spiritual support  Subjective: Sitting up in bed, requesting water and milk, tells me she feels well, oriented x4. Noted tachycardia on monitor. Oxygen off.   Length of Stay: 5  Current Medications: Scheduled Meds:  . aspirin  325 mg Oral Daily  . diltiazem  240 mg Oral Daily  . enoxaparin (LOVENOX) injection  30 mg Subcutaneous Q24H  . levothyroxine  25 mcg Intravenous Daily  . metoprolol tartrate  25 mg Oral BID  . pantoprazole (PROTONIX) IV  40 mg Intravenous Q24H  . pravastatin  40 mg Oral Daily    Continuous Infusions: . cefTRIAXone (ROCEPHIN)  IV Stopped (10/17/17 1847)    PRN Meds: acetaminophen **OR** acetaminophen (TYLENOL) oral liquid 160 mg/5 mL **OR** acetaminophen, feeding supplement (ENSURE ENLIVE), metoprolol tartrate, RESOURCE THICKENUP CLEAR, senna-docusate  Physical Exam  Constitutional: She is oriented to person, place, and time. She is cooperative. No distress.  HENT:  Head: Normocephalic and atraumatic.  Cardiovascular: Intact distal pulses. An irregularly irregular rhythm present.  Pulses:      Radial pulses are 2+ on the right side, and 2+ on the left side.       Dorsalis pedis pulses are 1+ on the right side, and 1+ on the left side.  Pulmonary/Chest: Effort normal. No accessory muscle usage. No tachypnea. No respiratory distress.  Abdominal: Soft. There is no tenderness.  Musculoskeletal:       Right lower leg: She exhibits no edema.       Left lower  leg: She exhibits no edema.  Neurological: She is alert and oriented to person, place, and time.  Skin: Skin is warm and dry. She is not diaphoretic.  Psychiatric: She has a normal mood and affect. Her speech is normal and behavior is normal.            Vital Signs: BP (!) 143/83 (BP Location: Right Arm)   Pulse 62   Temp (!) 97.5 F (36.4 C) (Axillary)   Resp (!) 21   Ht _0  (1.676 m)   Wt 63 kg (139 lb)   SpO2 91%   BMI 22.44 kg/m  SpO2: SpO2: 91 % O2 Device: O2 Device: Not Delivered O2 Flow Rate: O2 Flow Rate (L/min): 6 L/min  Intake/output summary: No intake or output data in the 24 hours ending 10/18/17 0839 LBM: Last BM Date: 10/12/17 Baseline Weight: Weight: 63 kg (139 lb) Most recent weight: Weight: 63 kg (139 lb)       Palliative Assessment/Data: 40%    Flowsheet Rows     Most Recent Value  Intake Tab  Referral Department  Hospitalist  Unit at Time of Referral  Intermediate Care Unit  Palliative Care Primary Diagnosis  Neurology  Date Notified  10/13/17  Palliative Care Type  New Palliative care  Reason for referral  Clarify Goals of Care  Date of Admission  10/13/17  Date first seen by Palliative Care  10/14/17  # of days Palliative referral response time  1 Day(s)  # of days IP prior to Palliative referral  0  Clinical Assessment  Palliative Performance Scale Score  40%  Pain Max last 24 hours  Not able to report  Pain Min Last 24 hours  Not able to report  Dyspnea Max Last 24 Hours  Not able to report  Dyspnea Min Last 24 hours  Not able to report  Nausea Max Last 24 Hours  Not able to report  Nausea Min Last 24 Hours  Not able to report  Anxiety Max Last 24 Hours  Not able to report  Anxiety Min Last 24 Hours  Not able to report  Other Max Last 24 Hours  Not able to report  Psychosocial & Spiritual Assessment  Palliative Care Outcomes  Patient/Family meeting held?  No  Palliative Care Outcomes  Clarified goals of care, Linked to palliative care  logitudinal support  Patient/Family wishes: Interventions discontinued/not started   BiPAP, Mechanical Ventilation, Trach, Tube feedings/TPN, PEG  Palliative Care follow-up planned  Yes, Facility      Patient Active Problem List   Diagnosis Date Noted  . Dysphagia   . Counseling regarding advanced care planning and goals of care   . Hyperlipidemia   . Acute kidney injury ([place])   . Atrial fibrillation with RVR ([place])   . Palliative care by specialist   . Acute right MCA stroke ([place]) 10/13/2017  . Acute lower UTI 10/13/2017  . Lactic acidosis 10/13/2017  . Toxic metabolic encephalopathy 30/07/2329  . Acute renal failure superimposed on stage 3 chronic kidney disease ([place]) 10/13/2017  . Fracture of left humerus 07/21/2012  . HTN (hypertension) 07/21/2012  . Hypothyroidism 07/21/2012  . Aortic aneurysm ([place]) 07/21/2012    Palliative Care Assessment & Plan   HPI: 83 y.o.femalewith past medical history of hypertension, hyperlipidemia, coronary artery disease, aortic aneurysm, and hypothyroidismadmitted on 11/29/2018with decreased oral intake and altered mental status. Per CT scan, patient was found to have an acute MCA stroke in the setting of new a fib. Per neurology's note, patient is a high risk for hemorrhagic conversion. Urine culture also positive for e.coli and lab work showed lactic acidosis - pt started on rocephin. Pt presented with afib RVR and started on cardizem. Patient was independent until three weeks ago and lives at [facility] - independent living facility. On 11/30, patient's oxygen saturation dropped and she did not tolerate bipap. Family elected DNR status. Morphine has been given intermittently for dyspnea. Per SLP, patient is definitely aspirating thin liquids - modified barium swallow not needed.  Consult orderedfor [facility].  Assessment: Family called last night as they were eager to meet with social work to discuss placement options for patient. Confirmed  with them that social work would contact them and discuss placement options moving forward, but patient was not ready for discharge yet.  Met w/patient at bedside this morning. She continues to be oriented, reports feeling well, and tells me she is enjoying being able to eat. Per chart review, appears that intake is minimal. Patient okay with thickened liquids.  Recommendations/Plan:  DNR/DNI  No artificial feeding including temporary feeding tube  PMT will continue to follow along - at this point family is not interested in hospice and wants to proceed with SNF placement and palliative follow up.   Limited oral intake is a concern moving forward, family educated on risks of aspiration and aware of poor oral intake.  Nectar thickened liquids  and pureed diet as patient tolerates, allow thin water - they understand aspiration risks  If patient refuses nectar thickened liquids, family requests she be given what she desires, understanding aspiration risk increases with thin liquids  Please write for palliative care follow up at rehab facility at discharge  Goals of Care and Additional Recommendations:  Limitations on Scope of Treatment: Initiate Comfort Feeding and No Artificial Feeding  Code Status:  DNR  Prognosis:   Unable to determine  Discharge Planning:  [facility] for rehab with Palliative care service follow-up  Care plan was discussed with bedside RN, patient  Thank you for allowing the Palliative Medicine Team to assist in the care of this patient.   Total Time 20 minutes Prolonged Time Billed  no      Greater than 50%  of this time was spent counseling and coordinating care related to the above assessment and plan.  Juel Burrow, DNP, AGNP-C Palliative Medicine Team Team Phone # [phone]

## 2017-10-18 NOTE — Discharge Summary (Addendum)
Physician Discharge Summary  Jennifer MiloMarguerite H Alperin ZOX:[id number] DOB: 1916/05/23 DOA: 10/13/2017  PCP: Johny BlamerHarris, William, MD  Admit date: 10/13/2017 Discharge date: 10/18/2017  Admitted From: home Disposition:  SNF   Recommendations for Outpatient Follow-up:  1. F/u on oral intake 2. D/c Aspirin and start Eliquis for A-fib on 12/9 3. Consider resuming Benicar if BP rises 4. Daily weights- watch for fluid overload  Discharge Condition:  stable   CODE STATUS:  DNR   Consultations:  Neuro     Discharge Diagnoses:  Principal Problem:   Acute right MCA stroke (HCC) Active Problems:   Acute lower UTI   Lactic acidosis Acute resp failure   Toxic metabolic encephalopathy   Acute renal failure superimposed on stage 3 chronic kidney disease (HCC)   Atrial fibrillation with RVR (HCC)   Palliative care by specialist   Acute kidney injury [facility](HCC)   Dysphagia   Counseling regarding advanced care planning and goals of care   Hyperlipidemia   HTN (hypertension)   Hypothyroidism   Subjective: No complaints.   Brief Summary: Jennifer Zavala a 83 year old woman with a past medical history significant for hypertension, hypothyroidism, coronary artery disease and hyperlipidemia; who presented to ED from her independent living facility due to failure to thrive, changes in mentation including refusing to do activities that she normally enjoyed, noteating, not drinking, being essentially confused, moaning and having difficulty expressing herself at times.  CT scan of the head positive for acute right MCA in setting of new A-fib with RVR.  Lactic acid 4.13  Hospital Course:   Acute right MCA stroke  - large stroke with high risk for hemorrhagic conversion -   -   MRI initially cancelled as she may be more hospice appropriate and has been quite restless as well - unable to do carotid duplex as she would not cooperate with moving her arms - family was considering hospice palliative  care initially due to her presentation with confusion, A-fib RVR and hypoxia and work up for CVA was placed on hold - 11/30- patient's O2 level was noted to drop to the 70s and at one point she was placed on a BiPAP as 100% FiO2 was not helping. Further discussion with her POA, Jennifer Zavala was had. She was unable to tolerare the BiPAP due to discomfort and it was decided to make her DNI and leave off the BiPAP and it appeared that she would need comfort care.   - 12/3- less resp distress- noted to have severe dysphagia with high risk for aspiration (possibly from CVA?) -  per family, they would like to be able to feed the patient and accept the risks of swallowing - will go ahead and start a diet and give meds crushed with applesauce - 12/4- tolerating pills and diet- still high risk for aspiration- respiratory status and HR has improved-  family would like to continue care for her instead of pursuing hospice at this point-  work up with ECHO, Carotid duplex and MRI then ordered- have asked neurology to give further recommendations as well  - ECHO shows no thrombus - carotid duplex show no stenosis - MRI re-attempted but she was still too restless - per neuro, ok to start Eliquis in 5 days   Active Problems: - Aifb with RVR- new finding  - holding off on anticoagulation initially to prevent hemorrhagic conversion of CVA - TSH normal - rate still uncontrolled- 11/30 added short acting Cardizem but have found out today that she cannot  swallow- started IV Cardizem instead - eventually switched to short acting Cardizem and then long acting - 12/4- rate doing reasonably well with oral Cardizem and Metoprolol now  Fluid overloaded- acute respiratory failure - possibly overloaded by IVF in the ER and was requiring 100% FiO2 - started IV Lasix- she has improved significantly and is now on room air -   ECHO shows EF  40-45% which is a new finding- see below  - cont lopressor  Mild acidosis  -  improved     HTN (hypertension) - currently on Lopressor and Cardizem    Hypothyroidism - Synthroid    Acute lower UTI - Ceftriaxone- have reviewed Cultures- treated adequately with Rocephin based on sensitivities    Lactic acidosis - in setting of A-fib with RVR- improved to normal    Toxic metabolic encephalopathy - due to CVA vs UTI- resolved     Acute renal failure superimposed on stage 3 chronic kidney disease   - baseline Cr 0.8- increase in Cr likely due to poor cardiac output due to rapid A-fib -  improving    Palliative care   - DNR- high risk for aspiration per SLP eval- will go to SNF with palliative care     Discharge Instructions  Discharge Instructions    Diet - low sodium heart healthy   Complete by:  As directed    Increase activity slowly   Complete by:  As directed      Allergies as of 10/18/2017      Reactions   Sulfa Antibiotics    unknown      Medication List    STOP taking these medications   aspirin EC 81 MG tablet Replaced by:  aspirin 325 MG tablet   atenolol 50 MG tablet Commonly known as:  TENORMIN   cholecalciferol 1000 units tablet Commonly known as:  VITAMIN D   olmesartan 40 MG tablet Commonly known as:  BENICAR   simvastatin 40 MG tablet Commonly known as:  ZOCOR     TAKE these medications   aspirin 325 MG tablet Take 1 tablet (325 mg total) by mouth daily. Start taking on:  10/19/2017 Replaces:  aspirin EC 81 MG tablet   diltiazem 240 MG 24 hr capsule Commonly known as:  CARDIZEM CD Take 1 capsule (240 mg total) by mouth daily.   ENSURE PLUS Liqd Take 237 mLs by mouth 3 (three) times daily between meals.   levothyroxine 50 MCG tablet Commonly known as:  SYNTHROID, LEVOTHROID Take 50 mcg by mouth daily before breakfast. What changed:  Another medication with the same name was removed. Continue taking this medication, and follow the directions you see here.   metoprolol tartrate 25 MG tablet Commonly  known as:  LOPRESSOR Take 1 tablet (25 mg total) by mouth 2 (two) times daily.   multivitamin with minerals Tabs tablet Take 1 tablet by mouth daily.   nitroGLYCERIN 0.4 MG SL tablet Commonly known as:  NITROSTAT Place 0.4 mg under the tongue every 5 (five) minutes as needed. For chest pain   Omega 3-6-9 Fatty Acids Liqd Take 30 mLs by mouth daily.   pravastatin 40 MG tablet Commonly known as:  PRAVACHOL Take 40 mg by mouth daily.      Contact information for after-discharge care    Destination    [facility] .   Service:  Skilled Nursing Contact information: [address] [place] [id number] [phone]  Allergies  Allergen Reactions  . Sulfa Antibiotics     unknown     Procedures/Studies: 2 D ECHO Left ventricle: The cavity size was normal. Systolic function was   mildly to moderately reduced. The estimated ejection fraction was   in the range of 40% to 45%. Diffuse hypokinesis. - Aortic valve: Transvalvular velocity was within the normal range.   There was no stenosis. There was no regurgitation. - Mitral valve: Transvalvular velocity was within the normal range.   There was no evidence for stenosis. There was trivial   regurgitation. - Left atrium: The atrium was severely dilated. - Right ventricle: The cavity size was normal. Wall thickness was   normal. Systolic function was normal. - Tricuspid valve: There was moderate regurgitation. - Pulmonary arteries: Systolic pressure was severely increased. PA   peak pressure: 61 mm Hg (S). - Pericardium, extracardiac: There was a left pleural effusion.  Ct Head Wo Contrast  Result Date: 10/13/2017 CLINICAL DATA:  Mental status changes. EXAM: CT HEAD WITHOUT CONTRAST TECHNIQUE: Contiguous axial images were obtained from the base of the skull through the vertex without intravenous contrast. COMPARISON:  07/21/2012 FINDINGS: Brain: Moderate low density in the  periventricular white matter likely related to small vessel disease. Cortically based infarct involving the posterior right MCA distribution, including on image 18/series 2. Sulci effacement. No midline shift. Expected cerebral and cerebellar volume loss. No hydrocephalus, hemorrhage, intra-axial, or extra-axial fluid collection. Vascular: Intracranial atherosclerosis. Skull: Normal Sinuses/Orbits: Normal imaged portions of the orbits and globes. Clear paranasal sinuses and mastoid air cells. Other: None. IMPRESSION: 1. Right posterior middle cerebral artery distribution cortically based infarct, favored to be subacute. Sulci effacement but no significant midline shift. 2.  Cerebral atrophy and small vessel ischemic change. These results were called by telephone at the time of interpretation on 10/13/2017 at 3:53 pm to Dr. Alvira Monday , who verbally acknowledged these results. Electronically Signed   By: Jeronimo Greaves M.D.   On: 10/13/2017 15:55   Dg Chest Port 1 View  Result Date: 10/16/2017 CLINICAL DATA:  Hypoxia EXAM: PORTABLE CHEST 1 VIEW COMPARISON:  10/15/2017 FINDINGS: Cardiomegaly with vascular congestion. Improving airspace opacities. Mild residual scratched at residual left perihilar and lower lobe airspace opacity concerning for pneumonia. Small bilateral effusions. IMPRESSION: Improving aeration in bilateral airspace opacities. Residual left lower lobe consolidation. Small effusions. Electronically Signed   By: Charlett Nose M.D.   On: 10/16/2017 07:52   Dg Chest Port 1 View  Result Date: 10/15/2017 CLINICAL DATA:  Hypoxia EXAM: PORTABLE CHEST 1 VIEW COMPARISON:  None. FINDINGS: Cardiomegaly with vascular congestion. Bilateral lower lobe airspace opacities, left greater than right could reflect asymmetric edema or infection. Suspect small left effusion. No acute bony abnormality. IMPRESSION: Bilateral airspace opacities, left greater than right which could reflect asymmetric edema or  infection. Small left effusion. Electronically Signed   By: Charlett Nose M.D.   On: 10/15/2017 08:54       Discharge Exam: Vitals:   10/18/17 1104 10/18/17 1150  BP: (!) 106/51 (!) 109/58  Pulse:  97  Resp:  (!) 25  Temp:  (!) 97.1 F (36.2 C)  SpO2:  94%   Vitals:   10/18/17 0713 10/18/17 0715 10/18/17 1104 10/18/17 1150  BP:  (!) 143/83 (!) 106/51 (!) 109/58  Pulse:  62  97  Resp:  (!) 21  (!) 25  Temp: (!) 97.5 F (36.4 C)   (!) 97.1 F (36.2 C)  TempSrc: Axillary   Axillary  SpO2:  91%  94%  Weight:      Height:        General: Pt is alert, awake, not in acute distress Cardiovascular: RRR, S1/S2 +, no rubs, no gallops Respiratory: CTA bilaterally, no wheezing, no rhonchi Abdominal: Soft, NT, ND, bowel sounds + Extremities: no edema, no cyanosis    The results of significant diagnostics from this hospitalization (including imaging, microbiology, ancillary and laboratory) are listed below for reference.     Microbiology: Recent Results (from the past 240 hour(s))  Blood Culture (routine x 2)     Status: None   Collection Time: 10/13/17 12:35 PM  Result Value Ref Range Status   Specimen Description BLOOD LEFT ANTECUBITAL  Final   Special Requests   Final    BOTTLES DRAWN AEROBIC AND ANAEROBIC Blood Culture adequate volume   Culture   Final    NO GROWTH 5 DAYS Performed at [facility], [address] [place], [place] [address]    Report Status 10/18/2017 FINAL  Final  Urine culture     Status: Abnormal   Collection Time: 10/13/17 12:45 PM  Result Value Ref Range Status   Specimen Description URINE, CATHETERIZED  Final   Special Requests Normal  Final   Culture 30,000 COLONIES/mL ESCHERICHIA COLI (A)  Final   Report Status 10/15/2017 FINAL  Final   Organism ID, Bacteria ESCHERICHIA COLI (A)  Final      Susceptibility   Escherichia coli - MIC*    AMPICILLIN 4 SENSITIVE Sensitive     CEFAZOLIN <=4 SENSITIVE Sensitive     CEFTRIAXONE <=1 SENSITIVE  Sensitive     CIPROFLOXACIN <=0.25 SENSITIVE Sensitive     GENTAMICIN <=1 SENSITIVE Sensitive     IMIPENEM <=0.25 SENSITIVE Sensitive     NITROFURANTOIN 64 INTERMEDIATE Intermediate     TRIMETH/SULFA <=20 SENSITIVE Sensitive     AMPICILLIN/SULBACTAM <=2 SENSITIVE Sensitive     PIP/TAZO <=4 SENSITIVE Sensitive     Extended ESBL NEGATIVE Sensitive     * 30,000 COLONIES/mL ESCHERICHIA COLI  Blood Culture (routine x 2)     Status: None   Collection Time: 10/13/17  2:30 PM  Result Value Ref Range Status   Specimen Description BLOOD RIGHT ANTECUBITAL  Final   Special Requests   Final    BOTTLES DRAWN AEROBIC AND ANAEROBIC Blood Culture adequate volume   Culture   Final    NO GROWTH 5 DAYS Performed at [facility], [address], [place], [place] [address]    Report Status 10/18/2017 FINAL  Final  MRSA PCR Screening     Status: None   Collection Time: 10/14/17  1:30 AM  Result Value Ref Range Status   MRSA by PCR NEGATIVE NEGATIVE Final    Comment:        The GeneXpert MRSA Assay (FDA approved for NASAL specimens only), is one component of a comprehensive MRSA colonization surveillance program. It is not intended to diagnose MRSA infection nor to guide or monitor treatment for MRSA infections.      Labs: BNP (last 3 results) No results for input(s): BNP in the last 8760 hours. Basic Metabolic Panel: Recent Labs  Lab 10/14/17 0226 10/15/17 0303 10/16/17 0615 10/17/17 0223 10/18/17 0242  NA 141 145 147* 151* 146*  K 4.8 4.2 3.7 3.1* 3.7  CL 113* 115* 114* 113* 106  CO2 16* 16* 16* 23 21*  GLUCOSE 122* 72 91 117* 101*  BUN 53* 52* 48* 45* 43*  CREATININE 1.58* 1.54* 1.42* 1.32* 1.19*  CALCIUM 8.3* 8.0* 8.2* 8.9 8.7*   Liver Function Tests: Recent Labs  Lab 10/13/17 1243  AST 64*  ALT 72*  ALKPHOS 111  BILITOT 1.3*  PROT 7.7  ALBUMIN 4.0   No results for input(s): LIPASE, AMYLASE in the last 168 hours. No results for input(s): AMMONIA in the last 168  hours. CBC: Recent Labs  Lab 10/13/17 1243 10/14/17 0226 10/15/17 0303 10/16/17 0615  WBC 8.8 12.9* 11.1* 11.0*  NEUTROABS 7.3  --   --   --   HGB 12.0 11.5* 12.5 11.8*  HCT 36.8 36.6 38.3 38.4  MCV 97.4 98.7 98.5 99.0  PLT 289 232 187 172   Cardiac Enzymes: No results for input(s): CKTOTAL, CKMB, CKMBINDEX, TROPONINI in the last 168 hours. BNP: Invalid input(s): POCBNP CBG: Recent Labs  Lab 10/16/17 0557  GLUCAP 87   D-Dimer No results for input(s): DDIMER in the last 72 hours. Hgb A1c No results for input(s): HGBA1C in the last 72 hours. Lipid Profile No results for input(s): CHOL, HDL, LDLCALC, TRIG, CHOLHDL, LDLDIRECT in the last 72 hours. Thyroid function studies No results for input(s): TSH, T4TOTAL, T3FREE, THYROIDAB in the last 72 hours.  Invalid input(s): FREET3 Anemia work up No results for input(s): VITAMINB12, FOLATE, FERRITIN, TIBC, IRON, RETICCTPCT in the last 72 hours. Urinalysis    Component Value Date/Time   COLORURINE AMBER (A) 10/13/2017 1245   APPEARANCEUR HAZY (A) 10/13/2017 1245   LABSPEC 1.018 10/13/2017 1245   PHURINE 5.0 10/13/2017 1245   GLUCOSEU NEGATIVE 10/13/2017 1245   HGBUR NEGATIVE 10/13/2017 1245   BILIRUBINUR NEGATIVE 10/13/2017 1245   KETONESUR 5 (A) 10/13/2017 1245   PROTEINUR 100 (A) 10/13/2017 1245   NITRITE NEGATIVE 10/13/2017 1245   LEUKOCYTESUR NEGATIVE 10/13/2017 1245   Sepsis Labs Invalid input(s): PROCALCITONIN,  WBC,  LACTICIDVEN Microbiology Recent Results (from the past 240 hour(s))  Blood Culture (routine x 2)     Status: None   Collection Time: 10/13/17 12:35 PM  Result Value Ref Range Status   Specimen Description BLOOD LEFT ANTECUBITAL  Final   Special Requests   Final    BOTTLES DRAWN AEROBIC AND ANAEROBIC Blood Culture adequate volume   Culture   Final    NO GROWTH 5 DAYS Performed at [facility], [address] [place], [place] [address]    Report Status 10/18/2017 FINAL  Final  Urine  culture     Status: Abnormal   Collection Time: 10/13/17 12:45 PM  Result Value Ref Range Status   Specimen Description URINE, CATHETERIZED  Final   Special Requests Normal  Final   Culture 30,000 COLONIES/mL ESCHERICHIA COLI (A)  Final   Report Status 10/15/2017 FINAL  Final   Organism ID, Bacteria ESCHERICHIA COLI (A)  Final      Susceptibility   Escherichia coli - MIC*    AMPICILLIN 4 SENSITIVE Sensitive     CEFAZOLIN <=4 SENSITIVE Sensitive     CEFTRIAXONE <=1 SENSITIVE Sensitive     CIPROFLOXACIN <=0.25 SENSITIVE Sensitive     GENTAMICIN <=1 SENSITIVE Sensitive     IMIPENEM <=0.25 SENSITIVE Sensitive     NITROFURANTOIN 64 INTERMEDIATE Intermediate     TRIMETH/SULFA <=20 SENSITIVE Sensitive     AMPICILLIN/SULBACTAM <=2 SENSITIVE Sensitive     PIP/TAZO <=4 SENSITIVE Sensitive     Extended ESBL NEGATIVE Sensitive     * 30,000 COLONIES/mL ESCHERICHIA COLI  Blood Culture (routine x 2)  Status: None   Collection Time: 10/13/17  2:30 PM  Result Value Ref Range Status   Specimen Description BLOOD RIGHT ANTECUBITAL  Final   Special Requests   Final    BOTTLES DRAWN AEROBIC AND ANAEROBIC Blood Culture adequate volume   Culture   Final    NO GROWTH 5 DAYS Performed at [facility], [address], [place], [place] [address]    Report Status 10/18/2017 FINAL  Final  MRSA PCR Screening     Status: None   Collection Time: 10/14/17  1:30 AM  Result Value Ref Range Status   MRSA by PCR NEGATIVE NEGATIVE Final    Comment:        The GeneXpert MRSA Assay (FDA approved for NASAL specimens only), is one component of a comprehensive MRSA colonization surveillance program. It is not intended to diagnose MRSA infection nor to guide or monitor treatment for MRSA infections.      Time coordinating discharge: Over 30 minutes  SIGNED:   Calvert Cantor, MD  [facility] 10/18/2017, 2:35 PM Pager   If 7PM-7AM, please contact night-coverage [email or web address] Password  [email or web address]

## 2017-10-18 NOTE — Progress Notes (Signed)
Occupational Therapy Treatment and Re-evaluation Patient Details Name: Jennifer Zavala MRN: [id number] DOB: [birth year] Today's Date: 10/18/2017    History of present illness 83 yo female admitted from independent living with CT (+) R MCA pending MRI and UTI.    OT comments  Pt presenting to OT pleasant but confused. She requires mod assist for self-feeding and requiring verbal and tactile cues for appropriate bite sizes. She was able to complete grooming tasks with min assist at bed level as well. Pt requiring total assistance for dressing and bathing tasks. She presents with decreased attention to L side of her body and visual field impacting her independence with ADL. She additionally presents with cognitive deficits as detailed below. She would benefit from SNF level rehabilitation post-acute D/C to maximize return to PLOF. OT will continue to follow.    Follow Up Recommendations  SNF;Supervision/Assistance - 24 hour    Equipment Recommendations  Other (comment)(TBD at next venue of care)    Recommendations for Other Services      Precautions / Restrictions Precautions Precautions: Fall Precaution Comments: bil mittens Restrictions Weight Bearing Restrictions: No       Mobility Bed Mobility               General bed mobility comments: Session focused on self-feeding and attention to L side of environment and body.   Transfers                      Balance                                           ADL either performed or assessed with clinical judgement   ADL Overall ADL's : Needs assistance/impaired Eating/Feeding: Moderate assistance;Bed level   Grooming: Supervision/safety;Bed level                                 General ADL Comments: Total assist otherwise for ADL. Decreased attention to L side of body and visual field impacting her safety and independence.      Vision   Vision Assessment?: Vision impaired-  to be further tested in functional context Additional Comments: Need to assess L sided vision   Perception     Praxis      Cognition Arousal/Alertness: Awake/alert Behavior During Therapy: Restless Overall Cognitive Status: Difficult to assess Area of Impairment: Orientation;Attention;Memory;Following commands;Safety/judgement;Awareness;Problem solving                 Orientation Level: Disoriented to;Place;Time;Situation Current Attention Level: Focused Memory: Decreased short-term memory Following Commands: Follows one step commands inconsistently Safety/Judgement: Decreased awareness of deficits Awareness: Intellectual Problem Solving: Slow processing;Decreased initiation;Difficulty sequencing;Requires verbal cues;Requires tactile cues General Comments: Pt able to intermittently follow simple commands with tactile and verbal cues. Answers yes and no questions.         Exercises     Shoulder Instructions       General Comments VSS    Pertinent Vitals/ Pain       Pain Assessment: Faces Faces Pain Scale: Hurts even more Pain Location: groaning out at times but no grimacing at other times Pain Intervention(s): Limited activity within patient's tolerance;Monitored during session;Repositioned  Home Living Family/patient expects to be discharged to:: Skilled nursing facility     Type of Home: Independent living facility  Lives With: Alone    Prior Functioning/Environment Level of Independence: Independent            Frequency  Min 2X/week        Progress Toward Goals  OT Goals(current goals can now be found in the care plan section)     Acute Rehab OT Goals Patient Stated Goal: to drink more milk OT Goal Formulation: Patient unable to participate in goal setting Time For Goal Achievement: 10/28/17 Potential to Achieve Goals: Good ADL Goals Additional ADL Goal #2: Pt will follow 3/4 one step commands  during morning ADL routines.  Plan      Co-evaluation                 AM-PAC PT "6 Clicks" Daily Activity     Outcome Measure   Help from another person eating meals?: A Lot Help from another person taking care of personal grooming?: A Little Help from another person toileting, which includes using toliet, bedpan, or urinal?: Total Help from another person bathing (including washing, rinsing, drying)?: Total Help from another person to put on and taking off regular upper body clothing?: Total Help from another person to put on and taking off regular lower body clothing?: Total 6 Click Score: 9    End of Session    OT Visit Diagnosis: Unsteadiness on feet (R26.81);Muscle weakness (generalized) (M62.81);Other symptoms and signs involving cognitive function   Activity Tolerance Patient tolerated treatment well   Patient Left in bed;with call bell/phone within reach;with bed alarm set;with nursing/sitter in room   Nurse Communication Mobility status;Precautions        Time: 1330-1407 OT Time Calculation (min): 37 min  Charges: OT General Charges $OT Visit: 1 Visit OT Evaluation $OT Re-eval: 1 Re-eval OT Treatments $Self Care/Home Management : 8-22 mins  Jennifer Sectionharity A Jaquane Boughner, MS OTR/L  Pager: [phone]    Jennifer Zavala 10/18/2017, 5:54 PM

## 2017-10-18 NOTE — Progress Notes (Signed)
CT called and patient is not willing to get on the Ct table and is uncooperative with CT tech. Unable to complete CT scan. Notified Dr Clearence PedSchorr.

## 2017-10-18 NOTE — Progress Notes (Signed)
Discharged  [facility], discharge paper given to ambulance staff, called faciltiy 2x for report but no one is picking up the phone. Son to drop by tom. to pick up the flowers since ambulance cannot take it.

## 2017-10-18 NOTE — Progress Notes (Signed)
STROKE TEAM PROGRESS NOTE   SUBJECTIVE (INTERVAL HISTORY) Patient is sitting in chair, no family at bedside. Pt is saying " I want my medications". Pt still has mild left hemiparesis with left neglect. Palliative care on board, due to pt dramatic improvement, now family would like more aggressive management. Put back on ASA yesterday. MRI/MRA.ordered but patient uncooperative for exam. CUS and TTE completed   OBJECTIVE Lab Results: CBC:  Recent Labs  Lab 10/14/17 0226 10/15/17 0303 10/16/17 0615  WBC 12.9* 11.1* 11.0*  HGB 11.5* 12.5 11.8*  HCT 36.6 38.3 38.4  MCV 98.7 98.5 99.0  PLT 232 187 172   BMP: Recent Labs  Lab 10/14/17 0226 10/15/17 0303 10/16/17 0615 10/17/17 0223 10/18/17 0242  NA 141 145 147* 151* 146*  K 4.8 4.2 3.7 3.1* 3.7  CL 113* 115* 114* 113* 106  CO2 16* 16* 16* 23 21*  GLUCOSE 122* 72 91 117* 101*  BUN 53* 52* 48* 45* 43*  CREATININE 1.58* 1.54* 1.42* 1.32* 1.19*  CALCIUM 8.3* 8.0* 8.2* 8.9 8.7*   Liver Function Tests:  Recent Labs  Lab 10/13/17 1243  AST 64*  ALT 72*  ALKPHOS 111  BILITOT 1.3*  PROT 7.7  ALBUMIN 4.0   Thyroid Function Studies:  No results for input(s): TSH, T4TOTAL, T3FREE, THYROIDAB in the last 72 hours. Urinalysis:  Recent Labs  Lab 10/13/17 1245  COLORURINE AMBER*  APPEARANCEUR HAZY*  LABSPEC 1.018  PHURINE 5.0  GLUCOSEU NEGATIVE  HGBUR NEGATIVE  BILIRUBINUR NEGATIVE  KETONESUR 5*  PROTEINUR 100*  NITRITE NEGATIVE  LEUKOCYTESUR NEGATIVE    PHYSICAL EXAM Temp:  [97.1 F (36.2 C)-98.1 F (36.7 C)] 97.1 F (36.2 C) (12/04 1150) Pulse Rate:  [51-103] 62 (12/04 0715) Resp:  [14-22] 21 (12/04 0715) BP: (90-143)/(51-96) 109/58 (12/04 1150) SpO2:  [90 %-95 %] 91 % (12/04 0715)  General - thin built, well developed, confused.  Ophthalmologic - Fundi not visualized due to noncooperation.  Cardiovascular - irregularly irregular heart rate and rhythm  Neuro - awake, alert, able to follow limited simple  commands. Orientated to place, year, self and age, not orientated to hospital name, month. Left gaze preference, not cross midline, right neglect, blinking to visual threat on the right, but not to left, PERRL. No significant facial asymmetry, mild dysarthria. Tongue midline. RUE and RLE normal strength, LUE and LLE 4/5.Left sided neglect. DTR 1+ and no babinski. Sensation, coordination not cooperative and gait not tested.  IMAGING: I have personally reviewed the radiological images below and agree with the radiology interpretations.  Ct Head Wo Contrast Result Date: 10/13/2017 IMPRESSION: 1. Right posterior middle cerebral artery distribution cortically based infarct, favored to be subacute. Sulci effacement but no significant midline shift. 2.  Cerebral atrophy and small vessel ischemic change. These results were called by telephone at the time of interpretation on 10/13/2017 at 3:53 pm to Dr. Alvira Monday , who verbally acknowledged these results. Electronically Signed   By: Jeronimo Greaves M.D.   On: 10/13/2017 15:55   MRI/MRA - patient not cooperative for testing  Echocardiogram:  Study Conclusions - Left ventricle: The cavity size was normal. Systolic function was   mildly to moderately reduced. The estimated ejection fraction was   in the range of 40% to 45%. Diffuse hypokinesis. - Aortic valve: Transvalvular velocity was within the normal range.   There was no stenosis. There was no regurgitation. - Mitral valve: Transvalvular velocity was within the normal range.   There was no evidence for  stenosis. There was trivial   regurgitation. - Left atrium: The atrium was severely dilated. - Right ventricle: The cavity size was normal. Wall thickness was   normal. Systolic function was normal. - Tricuspid valve: There was moderate regurgitation. - Pulmonary arteries: Systolic pressure was severely increased. PA   peak pressure: 61 mm Hg (S). - Pericardium, extracardiac: There was a left  pleural effusion.  B/L Carotid U/S:  Final Interpretation: Right Carotid: There is evidence in the right ICA of a 1-39% stenosis. Left Carotid: There is evidence in the left ICA of a 1-39% stenosis. Vertebrals: Both vertebral arteries were patent with antegrade flow. Subclavians: Normal flow hemodynamics were seen in bilateral subclavian arteries.  ASSESSMENT: Ms. Shellee MiloMarguerite H Carden is a 83 y.o. female with PMH of HTN, HLD, Hypothyroidism admitted for confusion and failure to thrive. Noncontrast CT of the head shows a subacute right MCA territory large stroke. Her exam is exhibiting neglect of the left side and left hemiparesis along with possible left visual field cut. She is DNR but has dramatic improvement over the last several days and now family would like more aggressive management.  10/18/17: Neuro exam remains stable. Some spontaneous movement of Left side noted on exam today. Continues to have significant Left sided neglect. Per report patient uncooperative for MRI testing. ECHO and Carotid U/S no acute findings. Would start Eliquis in 5 days if family in agreement. No family at bedside to discuss. POC reviewed with Dr Butler Denmarkizwan  STROKE: RIGHT MCA posterior division, most likely embolic from newly diagnosed afib  Resultant Symptoms: possible left hemianopsia, right gaze preference, mild dysarthria, Left sided weakness  CT head - right MCA large infarct  MRI and MRA - unable to obtain  CUS No significant stenosis  TTE EF 40-45% No PFO or embolic source  LDL 88  A1C 5.8  lovenox for DVT prophylaxis  Dysphagia 1 diet with think liquid  on Aspirin now, Start Eliquis in 5 days (10/23/17) due to large size of infarct.  Ongoing aggressive stroke risk factor management  Therapy recommendation - SNF  Deposition - SNF  Afib, new diagnosis  Confirmed on EKG  Likely the cause of stroke  On ASA now  Start Eliquis in 5 days (10/23/17) due to large stroke. Once eliquis  started, ASA can be discontinued  HYPERTENSION: Stable Long term BP goal normotensive.  HYPERLIPIDEMIA:  Home Meds:  Pravastatin 40 mg  LDL 88 goal < 70  Continued Pravastatin 40 mg daily  Continue statin at discharge  DYSPHAGIA:  passes SLP swallow evaluation  On diet  UTI:  On Rocephin   per Medicine team  Other Stroke Risk Factors:   Advanced age  Coronary artery disease  Hospital day # 5  Brita RompMary A Costello, ANP-C Stroke Neurology 10/18/2017 11:54 AM  Attending note: I reviewed above note and agree with the assessment and plan. I have made any additions or clarifications directly to the above note. Pt was seen and examined.  83 year old female admitted for right MCA large stroke, symptoms improved over time, still has left hemiparesis, arm more than leg.  Found to have new diagnosed atrial fibrillation, currently on aspirin.  Consider Eliquis in 5 days due to large size of the infarct.  Continue statin for stroke prevention.  PT OT recommended SNF.  And palliative care recommend continue palliative care follow up at SNF.  Neurology will sign off. Please call with questions. Pt will follow up with Darrol Angelarolyn Martin, NP, at [facility] in about 6  weeks. Thanks for the consult.  Marvel PlanJindong Ercia Crisafulli, MD PhD Stroke Neurology 10/18/2017 4:11 PM   To contact Stroke Continuity provider, please refer to [email or web address]. After hours, contact General Neurology

## 2017-10-18 NOTE — Progress Notes (Signed)
Clinical Social Worker facilitated patient discharge including contacting patient family and facility to confirm patient discharge plans.  Clinical information faxed to facility and family agreeable with plan.  CSW arranged ambulance transport via [facility] to [facility] .  RN to call [phone] for report prior to discharge.  Clinical Social Worker will sign off for now as social work intervention is no longer needed. Please consult us again if new need arises.  Jovante Hammitt, MSW, LCSWA [phone]  

## 2017-10-19 ENCOUNTER — Non-Acute Institutional Stay (SKILLED_NURSING_FACILITY): Payer: Medicare Other | Admitting: Internal Medicine

## 2017-10-19 ENCOUNTER — Encounter: Payer: Self-pay | Admitting: Internal Medicine

## 2017-10-19 DIAGNOSIS — G92 Toxic encephalopathy: Secondary | ICD-10-CM

## 2017-10-19 DIAGNOSIS — R1312 Dysphagia, oropharyngeal phase: Secondary | ICD-10-CM | POA: Diagnosis not present

## 2017-10-19 DIAGNOSIS — E785 Hyperlipidemia, unspecified: Secondary | ICD-10-CM | POA: Diagnosis not present

## 2017-10-19 DIAGNOSIS — I4891 Unspecified atrial fibrillation: Secondary | ICD-10-CM | POA: Diagnosis not present

## 2017-10-19 DIAGNOSIS — N179 Acute kidney failure, unspecified: Secondary | ICD-10-CM | POA: Diagnosis not present

## 2017-10-19 DIAGNOSIS — I10 Essential (primary) hypertension: Secondary | ICD-10-CM

## 2017-10-19 DIAGNOSIS — I63511 Cerebral infarction due to unspecified occlusion or stenosis of right middle cerebral artery: Secondary | ICD-10-CM

## 2017-10-19 DIAGNOSIS — E034 Atrophy of thyroid (acquired): Secondary | ICD-10-CM

## 2017-10-19 DIAGNOSIS — B9620 Unspecified Escherichia coli [E. coli] as the cause of diseases classified elsewhere: Secondary | ICD-10-CM

## 2017-10-19 DIAGNOSIS — N390 Urinary tract infection, site not specified: Secondary | ICD-10-CM

## 2017-10-19 DIAGNOSIS — G928 Other toxic encephalopathy: Secondary | ICD-10-CM

## 2017-10-19 DIAGNOSIS — N183 Chronic kidney disease, stage 3 (moderate): Secondary | ICD-10-CM

## 2017-10-19 DIAGNOSIS — J9601 Acute respiratory failure with hypoxia: Secondary | ICD-10-CM

## 2017-10-19 NOTE — Progress Notes (Signed)
: Provider:  Randon GoldsmithAnne D. Lyn HollingsheadAlexander, MD Location:  [facility] Room Number: [phone] Place of Service:  SNF ([phone])  PCP: Johny BlamerHarris, William, MD Patient Care Team: Johny BlamerHarris, William, MD as PCP - General (Family Medicine)  Extended Emergency Contact Information Primary Emergency Contact: Eldridge DaceGibson,Ed          [place] Home Phone: [phone] Relation: Son Secondary Emergency Contact: Gregery NaGibson,Vickie  [place] Home Phone: [phone] Relation: Relative     Allergies: Sulfa antibiotics  Chief Complaint  Patient presents with  . New Admit To SNF    following hospitalization 10/13/17 to 10/18/17 acute right MCA stroke    HPI: Patient is 83 y.o. female with hypertension, hypothyroidism, coronary artery disease, and hyperlipidemia, who [facility] living facility due to failure to thrive, changes in mentation and including refusing to do activities that she had normally enjoyed, not eating, not drinking, being confused, moaning and having difficulty expressing herself. CT scan of the head in the ED showed an acute right MCA stroke in the setting of new atrial fib with RVR and patient was [facility] from 11/21-12/4 for workup and management of new stroke and atrial fib. Initial family was for comfort care only as patient's O2 sat dropped for no reason and patient was not able to tolerate BiPAP and was noted to have severe dysphagia with a high risk for aspiration, it which time patient's family opted for hospice; however later when she was tolerating pills and diet family okayed workup including echo, carotid duplex and MRI-not done secondary to patient movement. A. fib with RVR was treated with IV Cardizem and then converted to by mouth Cardizem and metoprolol. Hospital course was complicated by fluid overload probably from IV fluid in the ED for which patient responded to Lasix IV very  well. Patient was also found to have and UTI, a toxic metabolic encephalopathy which has resolved and acute renal failure on chronic kidney disease stage III. Having survived all this patient is admitted to skilled nursing facility for OT/PT. While at skilled nursing facility patient will be followed for hypertension treated with Lopressor and Cardizem, hypothyroidism treated with Synthroid and hyperlipidemia treated with protocol and omega-3 fatty acids.  Past Medical History:  Diagnosis Date  . Acute kidney injury (HCC)   . Acute lower UTI 10/13/2017  . Acute renal failure superimposed on stage 3 chronic kidney disease (HCC) 10/13/2017  . Acute right MCA stroke (HCC) 10/13/2017  . Angina at rest [facility](HCC)   . Aortic aneurysm (HCC)   . Coronary artery disease   . Dysphagia   . Fracture of left humerus 07/21/2012  . HTN (hypertension) 07/21/2012  . Hypertension   . Hypothyroid   . Hypothyroidism 07/21/2012  . Toxic metabolic encephalopathy 10/13/2017    Past Surgical History:  Procedure Laterality Date  . ABDOMINAL HYSTERECTOMY    . BLADDER SURGERY    . CAROTID ARTERY ANGIOPLASTY    . CATARACT EXTRACTION      Allergies as of 10/19/2017      Reactions   Sulfa Antibiotics    unknown      Medication List        Accurate as of 10/19/17 11:06 AM. Always use your most recent med list.          aspirin 325 MG tablet Take 1 tablet (325 mg total) by mouth daily.   diltiazem 240 MG 24  hr capsule Commonly known as:  CARDIZEM CD Take 1 capsule (240 mg total) by mouth daily.   ENSURE PLUS Liqd Take 237 mLs by mouth 3 (three) times daily between meals.   levothyroxine 50 MCG tablet Commonly known as:  SYNTHROID, LEVOTHROID Take 50 mcg by mouth daily before breakfast.   metoprolol tartrate 25 MG tablet Commonly known as:  LOPRESSOR Take 1 tablet (25 mg total) by mouth 2 (two) times daily.   multivitamin with minerals Tabs tablet Take 1 tablet by mouth daily.   nitroGLYCERIN 0.4 MG  SL tablet Commonly known as:  NITROSTAT Place 0.4 mg under the tongue every 5 (five) minutes as needed. For chest pain   Omega 3-6-9 Fatty Acids Liqd Take 30 mLs by mouth daily.   pravastatin 40 MG tablet Commonly known as:  PRAVACHOL Take 40 mg by mouth daily.       No orders of the defined types were placed in this encounter.    There is no immunization history on file for this patient.  Social History   Tobacco Use  . Smoking status: Former Games developer  . Smokeless tobacco: Never Used  Substance Use Topics  . Alcohol use: No    Family history is unable to obtain secondary to patient mental status from CVA      Review of Systems  DATA OBTAINED: from patient-limited; nurse-no concerns GENERAL:  no fevers, fatigue, appetite changes SKIN: No itching, or rash EYES: No eye pain, redness, discharge EARS: No earache, tinnitus, change in hearing NOSE: No congestion, drainage or bleeding  MOUTH/THROAT: No mouth or tooth pain, No sore throat RESPIRATORY: No cough, wheezing, SOB CARDIAC: No chest pain, palpitations, lower extremity edema  GI: No abdominal pain, No N/V/D or constipation, No heartburn or reflux  GU: No dysuria, frequency or urgency, or incontinence  MUSCULOSKELETAL: No unrelieved bone/joint pain NEUROLOGIC: No headache, dizziness or focal weakness PSYCHIATRIC: No c/o anxiety or sadness   Vitals:   10/19/17 1047  BP: 105/65  Pulse: 98  Resp: (!) 22  Temp: (!) 97.4 F (36.3 C)    SpO2 Readings from Last 1 Encounters:  10/18/17 90%   Body mass index is 22.44 kg/m.     Physical Exam  GENERAL APPEARANCE: Alert,   No acute distress.  SKIN: No diaphoresis rash HEAD: Normocephalic, atraumatic  EYES: Conjunctiva/lids clear. Pupils round, reactive. EOMs intact.  EARS: External exam WNL, canals clear. Hearing grossly normal.  NOSE: No deformity or discharge.  MOUTH/THROAT: Lips w/o lesions  RESPIRATORY: Breathing is even, unlabored. Lung sounds are  clear   CARDIOVASCULAR: Heart RRR no murmurs, rubs or gallops. No peripheral edema.   GASTROINTESTINAL: Abdomen is soft, non-tender, not distended w/ normal bowel sounds. GENITOURINARY: Bladder non tender, not distended  MUSCULOSKELETAL: No abnormal joints or musculature NEUROLOGIC:  Cranial nerves 2-12 grossly intact. Moves all extremities  PSYCHIATRIC: Mood and affect appropriate to situation, with limited communication no behavioral issues  Patient Active Problem List   Diagnosis Date Noted  . Dysphagia   . Counseling regarding advanced care planning and goals of care   . Hyperlipidemia   . Acute kidney injury (HCC)   . Atrial fibrillation with RVR (HCC)   . Palliative care by specialist   . Acute right MCA stroke (HCC) 10/13/2017  . Acute lower UTI 10/13/2017  . Lactic acidosis 10/13/2017  . Toxic metabolic encephalopathy 10/13/2017  . Acute renal failure superimposed on stage 3 chronic kidney disease (HCC) 10/13/2017  . Fracture of left humerus  07/21/2012  . HTN (hypertension) 07/21/2012  . Hypothyroidism 07/21/2012  . Aortic aneurysm (HCC) 07/21/2012      Labs reviewed: Basic Metabolic Panel:    Component Value Date/Time   NA 146 (H) 10/18/2017 0242   K 3.7 10/18/2017 0242   CL 106 10/18/2017 0242   CO2 21 (L) 10/18/2017 0242   GLUCOSE 101 (H) 10/18/2017 0242   BUN 43 (H) 10/18/2017 0242   CREATININE 1.19 (H) 10/18/2017 0242   CALCIUM 8.7 (L) 10/18/2017 0242   PROT 7.7 10/13/2017 1243   ALBUMIN 4.0 10/13/2017 1243   AST 64 (H) 10/13/2017 1243   ALT 72 (H) 10/13/2017 1243   ALKPHOS 111 10/13/2017 1243   BILITOT 1.3 (H) 10/13/2017 1243   GFRNONAA 36 (L) 10/18/2017 0242   GFRAA 42 (L) 10/18/2017 0242    Recent Labs    10/16/17 0615 10/17/17 0223 10/18/17 0242  NA 147* 151* 146*  K 3.7 3.1* 3.7  CL 114* 113* 106  CO2 16* 23 21*  GLUCOSE 91 117* 101*  BUN 48* 45* 43*  CREATININE 1.42* 1.32* 1.19*  CALCIUM 8.2* 8.9 8.7*   Liver Function Tests: Recent  Labs    10/13/17 1243  AST 64*  ALT 72*  ALKPHOS 111  BILITOT 1.3*  PROT 7.7  ALBUMIN 4.0   No results for input(s): LIPASE, AMYLASE in the last 8760 hours. No results for input(s): AMMONIA in the last 8760 hours. CBC: Recent Labs    10/13/17 1243 10/14/17 0226 10/15/17 0303 10/16/17 0615  WBC 8.8 12.9* 11.1* 11.0*  NEUTROABS 7.3  --   --   --   HGB 12.0 11.5* 12.5 11.8*  HCT 36.8 36.6 38.3 38.4  MCV 97.4 98.7 98.5 99.0  PLT 289 232 187 172   Lipid Recent Labs    10/14/17 0226  CHOL 145  HDL 34*  LDLCALC 88  TRIG 161    Cardiac Enzymes: No results for input(s): CKTOTAL, CKMB, CKMBINDEX, TROPONINI in the last 8760 hours. BNP: No results for input(s): BNP in the last 8760 hours. No results found for: [facility] Lab Results  Component Value Date   HGBA1C 5.8 (H) 10/14/2017   Lab Results  Component Value Date   TSH 2.146 10/13/2017   No results found for: VITAMINB12 No results found for: FOLATE No results found for: IRON, TIBC, FERRITIN  Imaging and Procedures obtained prior to SNF admission: Ct Head Wo Contrast  Result Date: 10/13/2017 CLINICAL DATA:  Mental status changes. EXAM: CT HEAD WITHOUT CONTRAST TECHNIQUE: Contiguous axial images were obtained from the base of the skull through the vertex without intravenous contrast. COMPARISON:  07/21/2012 FINDINGS: Brain: Moderate low density in the periventricular white matter likely related to small vessel disease. Cortically based infarct involving the posterior right MCA distribution, including on image 18/series 2. Sulci effacement. No midline shift. Expected cerebral and cerebellar volume loss. No hydrocephalus, hemorrhage, intra-axial, or extra-axial fluid collection. Vascular: Intracranial atherosclerosis. Skull: Normal Sinuses/Orbits: Normal imaged portions of the orbits and globes. Clear paranasal sinuses and mastoid air cells. Other: None. IMPRESSION: 1. Right posterior middle cerebral artery distribution  cortically based infarct, favored to be subacute. Sulci effacement but no significant midline shift. 2.  Cerebral atrophy and small vessel ischemic change. These results were called by telephone at the time of interpretation on 10/13/2017 at 3:53 pm to Dr. Alvira Monday , who verbally acknowledged these results. Electronically Signed   By: Jeronimo Greaves M.D.   On: 10/13/2017 15:55  Not all labs, radiology exams or other studies done during hospitalization come through on my EPIC note; however they are reviewed by me.    Assessment and Plan  ACUTE RIGHT MCA STROKE/DYSPHASIA-large stroke with high risk for hemorrhagic conversion; family was considering hospice care initially due to her presentation of confusion, A. fib with RVR and hypoxia so workup for CVA was placed on hold; however patient was tolerating pills and diet and risk for status and heart rate was improved family decided to continue care so pursuing hospice so workup was done with echo showing no thrombus, carotid duplex showing no stenosis and MRI-unable secondary to movement but patient primarily was okay to start Eliquis in 5 days SNF - admitted for OT/PT  ATRIAL FIB WITH RVR new finding-TSH normal; patient treated with IV Cardizem and then short acting Cardizem then long-acting Cardizem then oral Cardizem with metoprolol; patient could not swallow SNF - continue metoprolol 25 mg twice a day and diltiazem 240 CD 1 by mouth daily for rate and ASA 325 mg daily with stop date of 12/8; start Eliquis on 12/9; no dose was given but based on patient's age and risk factors I decided that 2.5 mg twice a day will be her dose  ACUTE RESPIRATORY FAILURE/FLUID OVERLOAD-probably from IV fluids in ED; was required 100% FiO2; improved with IV Lasix; echo shows EF of 40-45% which is new finding SNF - not requiring O2; continue to monitor and supportive care  ACUTE RENAL FAILURE ON EKG D stage III-secondary to A. fib with RVR; improved; baseline  creatinine 0.8 SNF - will follow-up BMP  E coli UTI-treated with Rocephin in the hospital and after reviewing cultures flulike was adequately treated with Rocephin based on sensitivities  TOXIC METABOLIC ENCEPHALOPATHY -secondary to CVA versus UTI-resolved SNF - supportive care  HYPERTENSION SNF-will control with the metoprolol 25 mg twice a day and Cardizem 240 CD 1 by mouth daily  HYPOTHYROIDISM SNF - controlled; continue 50 g by mouth daily  HYPERLIPIDEMIA SNF - not stated as uncontrolled; continue protocol 40 mg by mouth daily and omega-3 fatty acids 30 mils daily    Time spent greater than 45 minutes;> 50% of time with patient was spent reviewing records, labs, tests and studies, counseling and developing plan of care  Thurston Holenne D. Lyn HollingsheadAlexander, MD

## 2017-10-23 ENCOUNTER — Encounter: Payer: Self-pay | Admitting: Internal Medicine

## 2017-10-23 DIAGNOSIS — J9601 Acute respiratory failure with hypoxia: Secondary | ICD-10-CM | POA: Insufficient documentation

## 2017-10-23 DIAGNOSIS — B9620 Unspecified Escherichia coli [E. coli] as the cause of diseases classified elsewhere: Secondary | ICD-10-CM | POA: Insufficient documentation

## 2017-10-23 DIAGNOSIS — N390 Urinary tract infection, site not specified: Secondary | ICD-10-CM | POA: Insufficient documentation

## 2017-10-26 LAB — CBC AND DIFFERENTIAL
HEMATOCRIT: 48 — AB (ref 36–46)
HEMOGLOBIN: 15 (ref 12.0–16.0)
PLATELETS: 326 (ref 150–399)
WBC: 9

## 2017-10-26 LAB — BASIC METABOLIC PANEL
BUN: 20 (ref 4–21)
Creatinine: 0.6 (ref 0.5–1.1)
GLUCOSE: 64
Potassium: 4.1 (ref 3.4–5.3)
Sodium: 145 (ref 137–147)

## 2017-10-27 ENCOUNTER — Other Ambulatory Visit: Payer: Self-pay

## 2017-10-28 ENCOUNTER — Other Ambulatory Visit: Payer: Self-pay | Admitting: *Deleted

## 2017-10-28 NOTE — Patient Outreach (Signed)
[name]  10/28/2017  Jennifer Zavala Mar 12, 1916 [id number]   Met with [name], SW at facility. She reports that patient is from [place], but patient had a stroke and not doing well. S Family has applied from [facility] anticipating patient to main LTC at facility. Patient will not be returning to ILF.   Plan to sign off at this time as patient will not be going home, no community [facility] care management needs. Requested that [place] let RNCM know if patient does go home from facility. She reports she will let RNCM know if discharge plans change.  [name]. [name], RN, BSN, [place] [phone]) Business Cell  [phone]) Toll Free Office

## 2017-10-31 ENCOUNTER — Encounter: Payer: Self-pay | Admitting: Internal Medicine

## 2017-10-31 NOTE — Progress Notes (Signed)
Location:  [facility] Room Number: [phone] Place of Service:  SNF [phone])  Provider: Randon Goldsmith. Lyn Hollingshead, MD  Johny Blamer, MD  Patient Care Team: Johny Blamer, MD as PCP - General [facility])  Extended Emergency Contact Information Primary Emergency Contact: Eldridge Dace of [place] Home Phone: [phone] Relation: Son Secondary Emergency Contact: Gregery Na [place] of [place] Home Phone: [phone] Relation: Relative    Allergies: Sulfa antibiotics  Chief Complaint  Patient presents with  . Acute Visit    family consult    HPI: Patient is 83 y.o. female who   Past Medical History:  Diagnosis Date  . Acute kidney injury (HCC)   . Acute lower UTI 10/13/2017  . Acute renal failure superimposed on stage 3 chronic kidney disease (HCC) 10/13/2017  . Acute right MCA stroke (HCC) 10/13/2017  . Angina at rest [facility])   . Aortic aneurysm (HCC)   . Coronary artery disease   . Dysphagia   . Fracture of left humerus 07/21/2012  . HTN (hypertension) 07/21/2012  . Hypertension   . Hypothyroid   . Hypothyroidism 07/21/2012  . Toxic metabolic encephalopathy 10/13/2017    Past Surgical History:  Procedure Laterality Date  . ABDOMINAL HYSTERECTOMY    . BLADDER SURGERY    . CAROTID ARTERY ANGIOPLASTY    . CATARACT EXTRACTION      Allergies as of 10/31/2017      Reactions   Sulfa Antibiotics    unknown      Medication List        Accurate as of 10/31/17  3:42 PM. Always use your most recent med list.          diltiazem 240 MG 24 hr capsule Commonly known as:  CARDIZEM CD Take 1 capsule (240 mg total) by mouth daily.   ELIQUIS 2.5 MG Tabs tablet Generic drug:  apixaban Take 2.5 mg by mouth. Take one tablet twice daily   ENSURE PLUS Liqd Take 237 mLs by mouth 3 (three) times daily between meals.   levothyroxine 50 MCG tablet Commonly known as:  SYNTHROID, LEVOTHROID Take 50 mcg by  mouth daily before breakfast.   metoprolol tartrate 25 MG tablet Commonly known as:  LOPRESSOR Take 1 tablet (25 mg total) by mouth 2 (two) times daily.   multivitamin with minerals Tabs tablet Take 1 tablet by mouth daily.   nitroGLYCERIN 0.4 MG SL tablet Commonly known as:  NITROSTAT Place 0.4 mg under the tongue every 5 (five) minutes as needed. For chest pain   pravastatin 40 MG tablet Commonly known as:  PRAVACHOL Take 40 mg by mouth daily.       No orders of the defined types were placed in this encounter.    There is no immunization history on file for this patient.  Social History   Tobacco Use  . Smoking status: Former Games developer  . Smokeless tobacco: Never Used  Substance Use Topics  . Alcohol use: No    Review of Systems  DATA OBTAINED: from patient, nurse, medical record, family member GENERAL:  no fevers, fatigue, appetite changes SKIN: No itching, rash HEENT: No complaint RESPIRATORY: No cough, wheezing, SOB CARDIAC: No chest pain, palpitations, lower extremity edema  GI: No abdominal pain, No N/V/D or constipation, No heartburn or reflux  GU: No dysuria, frequency or urgency, or incontinence  MUSCULOSKELETAL: No unrelieved bone/joint pain NEUROLOGIC: No headache, dizziness  PSYCHIATRIC: No  overt anxiety or sadness  Vitals:   10/31/17 1535  BP: 139/80  Pulse: (!) 56  Resp: 16  Temp: 97.6 F (36.4 C)  SpO2: 94%   Body mass index is 19.37 kg/m. Physical Exam  GENERAL APPEARANCE: Alert, conversant, No acute distress  SKIN: No diaphoresis rash HEENT: Unremarkable RESPIRATORY: Breathing is even, unlabored. Lung sounds are clear   CARDIOVASCULAR: Heart RRR no murmurs, rubs or gallops. No peripheral edema  GASTROINTESTINAL: Abdomen is soft, non-tender, not distended w/ normal bowel sounds.  GENITOURINARY: Bladder non tender, not distended  MUSCULOSKELETAL: No abnormal joints or musculature NEUROLOGIC: Cranial nerves 2-12 grossly intact. Moves  all extremities PSYCHIATRIC: Mood and affect appropriate to situation, no behavioral issues  Patient Active Problem List   Diagnosis Date Noted  . Acute respiratory failure with hypoxia (HCC) 10/23/2017  . E. coli UTI 10/23/2017  . Dysphagia   . Counseling regarding advanced care planning and goals of care   . Hyperlipidemia   . Acute kidney injury (HCC)   . Atrial fibrillation with RVR (HCC)   . Palliative care by specialist   . Acute right MCA stroke (HCC) 10/13/2017  . Acute lower UTI 10/13/2017  . Lactic acidosis 10/13/2017  . Toxic metabolic encephalopathy 10/13/2017  . Acute renal failure superimposed on stage 3 chronic kidney disease (HCC) 10/13/2017  . Fracture of left humerus 07/21/2012  . HTN (hypertension) 07/21/2012  . Hypothyroidism 07/21/2012  . Aortic aneurysm (HCC) 07/21/2012    CMP     Component Value Date/Time   NA 145 10/26/2017   K 4.1 10/26/2017   CL 106 10/18/2017 0242   CO2 21 (L) 10/18/2017 0242   GLUCOSE 101 (H) 10/18/2017 0242   BUN 20 10/26/2017   CREATININE 0.6 10/26/2017   CREATININE 1.19 (H) 10/18/2017 0242   CALCIUM 8.7 (L) 10/18/2017 0242   PROT 7.7 10/13/2017 1243   ALBUMIN 4.0 10/13/2017 1243   AST 64 (H) 10/13/2017 1243   ALT 72 (H) 10/13/2017 1243   ALKPHOS 111 10/13/2017 1243   BILITOT 1.3 (H) 10/13/2017 1243   GFRNONAA 36 (L) 10/18/2017 0242   GFRAA 42 (L) 10/18/2017 0242   Recent Labs    10/16/17 0615 10/17/17 0223 10/18/17 0242 10/26/17  NA 147* 151* 146* 145  K 3.7 3.1* 3.7 4.1  CL 114* 113* 106  --   CO2 16* 23 21*  --   GLUCOSE 91 117* 101*  --   BUN 48* 45* 43* 20  CREATININE 1.42* 1.32* 1.19* 0.6  CALCIUM 8.2* 8.9 8.7*  --    Recent Labs    10/13/17 1243  AST 64*  ALT 72*  ALKPHOS 111  BILITOT 1.3*  PROT 7.7  ALBUMIN 4.0   Recent Labs    10/13/17 1243 10/14/17 0226 10/15/17 0303 10/16/17 0615 10/26/17  WBC 8.8 12.9* 11.1* 11.0* 9.0  NEUTROABS 7.3  --   --   --   --   HGB 12.0 11.5* 12.5 11.8*  15.0  HCT 36.8 36.6 38.3 38.4 48*  MCV 97.4 98.7 98.5 99.0  --   PLT 289 232 187 172 326   Recent Labs    10/14/17 0226  CHOL 145  LDLCALC 88  TRIG 116   No results found for: [facility] Lab Results  Component Value Date   TSH 2.146 10/13/2017   Lab Results  Component Value Date   HGBA1C 5.8 (H) 10/14/2017   Lab Results  Component Value Date   CHOL 145 10/14/2017   HDL  34 (L) 10/14/2017   LDLCALC 88 10/14/2017   TRIG 116 10/14/2017   CHOLHDL 4.3 10/14/2017    Significant Diagnostic Results in last 30 days:  Ct Head Wo Contrast  Result Date: 10/13/2017 CLINICAL DATA:  Mental status changes. EXAM: CT HEAD WITHOUT CONTRAST TECHNIQUE: Contiguous axial images were obtained from the base of the skull through the vertex without intravenous contrast. COMPARISON:  07/21/2012 FINDINGS: Brain: Moderate low density in the periventricular white matter likely related to small vessel disease. Cortically based infarct involving the posterior right MCA distribution, including on image 18/series 2. Sulci effacement. No midline shift. Expected cerebral and cerebellar volume loss. No hydrocephalus, hemorrhage, intra-axial, or extra-axial fluid collection. Vascular: Intracranial atherosclerosis. Skull: Normal Sinuses/Orbits: Normal imaged portions of the orbits and globes. Clear paranasal sinuses and mastoid air cells. Other: None. IMPRESSION: 1. Right posterior middle cerebral artery distribution cortically based infarct, favored to be subacute. Sulci effacement but no significant midline shift. 2.  Cerebral atrophy and small vessel ischemic change. These results were called by telephone at the time of interpretation on 10/13/2017 at 3:53 pm to Dr. Alvira MondayERIN SCHLOSSMAN , who verbally acknowledged these results. Electronically Signed   By: Jeronimo GreavesKyle  Talbot M.D.   On: 10/13/2017 15:55   Dg Chest Port 1 View  Result Date: 10/16/2017 CLINICAL DATA:  Hypoxia EXAM: PORTABLE CHEST 1 VIEW COMPARISON:  10/15/2017  FINDINGS: Cardiomegaly with vascular congestion. Improving airspace opacities. Mild residual scratched at residual left perihilar and lower lobe airspace opacity concerning for pneumonia. Small bilateral effusions. IMPRESSION: Improving aeration in bilateral airspace opacities. Residual left lower lobe consolidation. Small effusions. Electronically Signed   By: Charlett NoseKevin  Dover M.D.   On: 10/16/2017 07:52   Dg Chest Port 1 View  Result Date: 10/15/2017 CLINICAL DATA:  Hypoxia EXAM: PORTABLE CHEST 1 VIEW COMPARISON:  None. FINDINGS: Cardiomegaly with vascular congestion. Bilateral lower lobe airspace opacities, left greater than right could reflect asymmetric edema or infection. Suspect small left effusion. No acute bony abnormality. IMPRESSION: Bilateral airspace opacities, left greater than right which could reflect asymmetric edema or infection. Small left effusion. Electronically Signed   By: Charlett NoseKevin  Dover M.D.   On: 10/15/2017 08:54    Assessment and Plan  No problem-specific Assessment & Plan notes found for this encounter.   Labs/tests ordered:    Randon GoldsmithAnne D. Lyn HollingsheadAlexander, MD   This encounter was created in error - please disregard.

## 2017-12-05 ENCOUNTER — Encounter: Payer: Self-pay | Admitting: Internal Medicine

## 2017-12-05 ENCOUNTER — Non-Acute Institutional Stay (SKILLED_NURSING_FACILITY): Payer: Medicare Other | Admitting: Internal Medicine

## 2017-12-05 DIAGNOSIS — E785 Hyperlipidemia, unspecified: Secondary | ICD-10-CM | POA: Diagnosis not present

## 2017-12-05 DIAGNOSIS — E034 Atrophy of thyroid (acquired): Secondary | ICD-10-CM

## 2017-12-05 DIAGNOSIS — I10 Essential (primary) hypertension: Secondary | ICD-10-CM

## 2017-12-05 NOTE — Progress Notes (Signed)
Location:  [facility] Room Number: [phone] Place of Service:  SNF (31)  Jennifer Zavala. Lyn Hollingshead, MD  Patient Care Team: Johny Blamer, MD as PCP - General [facility])  Extended Emergency Contact Information Primary Emergency Contact: Eldridge Dace of [place] Home Phone: [phone] Relation: Son Secondary Emergency Contact: Gregery Na [place] of [place] Home Phone: [phone] Relation: Relative    Allergies: Sulfa antibiotics  Chief Complaint  Patient presents with  . Medical Management of Chronic Issues    Routine Visit    HPI: Patient is 84 y.o. female who is being seen for routine issues of hyperlipidemia, hypothyroidism, and hypertension.  Past Medical History:  Diagnosis Date  . Acute kidney injury (HCC)   . Acute lower UTI 10/13/2017  . Acute renal failure superimposed on stage 3 chronic kidney disease (HCC) 10/13/2017  . Acute right MCA stroke (HCC) 10/13/2017  . Angina at rest [facility])   . Aortic aneurysm (HCC)   . Coronary artery disease   . Dysphagia   . Fracture of left humerus 07/21/2012  . HTN (hypertension) 07/21/2012  . Hypertension   . Hypothyroid   . Hypothyroidism 07/21/2012  . Toxic metabolic encephalopathy 10/13/2017    Past Surgical History:  Procedure Laterality Date  . ABDOMINAL HYSTERECTOMY    . BLADDER SURGERY    . CAROTID ARTERY ANGIOPLASTY    . CATARACT EXTRACTION      Allergies as of 12/05/2017      Reactions   Sulfa Antibiotics    unknown      Medication List        Accurate as of 12/05/17 11:59 PM. Always use your most recent med list.          diltiazem 240 MG 24 hr capsule Commonly known as:  CARDIZEM CD Take 1 capsule (240 mg total) by mouth daily.   ELIQUIS 2.5 MG Tabs tablet Generic drug:  apixaban Take 2.5 mg by mouth 2 (two) times daily.   ENSURE PLUS Liqd Take 237 mLs by mouth 3 (three) times daily between meals.   levothyroxine 50 MCG  tablet Commonly known as:  SYNTHROID, LEVOTHROID Take 50 mcg by mouth daily before breakfast.   metoprolol tartrate 25 MG tablet Commonly known as:  LOPRESSOR Take 1 tablet (25 mg total) by mouth 2 (two) times daily.   multivitamin with minerals Tabs tablet Take 1 tablet by mouth daily.   nitroGLYCERIN 0.4 MG SL tablet Commonly known as:  NITROSTAT Place 0.4 mg under the tongue every 5 (five) minutes as needed. For chest pain   pravastatin 40 MG tablet Commonly known as:  PRAVACHOL Take 40 mg by mouth daily.       No orders of the defined types were placed in this encounter.    There is no immunization history on file for this patient.  Social History   Tobacco Use  . Smoking status: Former Games developer  . Smokeless tobacco: Never Used  Substance Use Topics  . Alcohol use: No    Review of Systems  DATA OBTAINED: from patient-limited; nursing-no concerns GENERAL:  no fevers, fatigue, appetite changes SKIN: No itching, rash HEENT: No complaint RESPIRATORY: No cough, wheezing, SOB CARDIAC: No chest pain, palpitations, lower extremity edema  GI: No abdominal pain, No N/V/D or constipation, No heartburn or reflux  GU: No dysuria, frequency or urgency, or incontinence  MUSCULOSKELETAL: No unrelieved bone/joint pain NEUROLOGIC: No headache, dizziness  PSYCHIATRIC: No overt anxiety or sadness  Vitals:   12/05/17 1232  BP: 125/78  Pulse: 88  Resp: 16  Temp: 97.6 F (36.4 C)  SpO2: 97%   Body mass index is 18.69 kg/m. Physical Exam  GENERAL APPEARANCE: Alert,  No acute distress  SKIN: No diaphoresis rash HEENT: Unremarkable RESPIRATORY: Breathing is even, unlabored. Lung sounds are clear   CARDIOVASCULAR: Heart RRR no murmurs, rubs or gallops. No peripheral edema  GASTROINTESTINAL: Abdomen is soft, non-tender, not distended w/ normal bowel sounds.  GENITOURINARY: Bladder non tender, not distended  MUSCULOSKELETAL: No abnormal joints or musculature NEUROLOGIC:  Cranial nerves 2-12 grossly intact. Moves all extremities PSYCHIATRIC: Mood and affect with limited kidney condition, no behavioral issues  Patient Active Problem List   Diagnosis Date Noted  . Acute respiratory failure with hypoxia (HCC) 10/23/2017  . E. coli UTI 10/23/2017  . Dysphagia   . Counseling regarding advanced care planning and goals of care   . Hyperlipidemia   . Acute kidney injury (HCC)   . Atrial fibrillation with RVR (HCC)   . Palliative care by specialist   . Acute right MCA stroke (HCC) 10/13/2017  . Acute lower UTI 10/13/2017  . Lactic acidosis 10/13/2017  . Toxic metabolic encephalopathy 10/13/2017  . Acute renal failure superimposed on stage 3 chronic kidney disease (HCC) 10/13/2017  . Fracture of left humerus 07/21/2012  . HTN (hypertension) 07/21/2012  . Hypothyroidism 07/21/2012  . Aortic aneurysm (HCC) 07/21/2012    CMP     Component Value Date/Time   NA 145 10/26/2017   K 4.1 10/26/2017   CL 106 10/18/2017 0242   CO2 21 (L) 10/18/2017 0242   GLUCOSE 101 (H) 10/18/2017 0242   BUN 20 10/26/2017   CREATININE 0.6 10/26/2017   CREATININE 1.19 (H) 10/18/2017 0242   CALCIUM 8.7 (L) 10/18/2017 0242   PROT 7.7 10/13/2017 1243   ALBUMIN 4.0 10/13/2017 1243   AST 64 (H) 10/13/2017 1243   ALT 72 (H) 10/13/2017 1243   ALKPHOS 111 10/13/2017 1243   BILITOT 1.3 (H) 10/13/2017 1243   GFRNONAA 36 (L) 10/18/2017 0242   GFRAA 42 (L) 10/18/2017 0242   Recent Labs    10/16/17 0615 10/17/17 0223 10/18/17 0242 10/26/17  NA 147* 151* 146* 145  K 3.7 3.1* 3.7 4.1  CL 114* 113* 106  --   CO2 16* 23 21*  --   GLUCOSE 91 117* 101*  --   BUN 48* 45* 43* 20  CREATININE 1.42* 1.32* 1.19* 0.6  CALCIUM 8.2* 8.9 8.7*  --    Recent Labs    10/13/17 1243  AST 64*  ALT 72*  ALKPHOS 111  BILITOT 1.3*  PROT 7.7  ALBUMIN 4.0   Recent Labs    10/13/17 1243 10/14/17 0226 10/15/17 0303 10/16/17 0615 10/26/17  WBC 8.8 12.9* 11.1* 11.0* 9.0  NEUTROABS 7.3  --    --   --   --   HGB 12.0 11.5* 12.5 11.8* 15.0  HCT 36.8 36.6 38.3 38.4 48*  MCV 97.4 98.7 98.5 99.0  --   PLT 289 232 187 172 326   Recent Labs    10/14/17 0226  CHOL 145  LDLCALC 88  TRIG 116   No results found for: [facility] Lab Results  Component Value Date   TSH 2.146 10/13/2017   Lab Results  Component Value Date   HGBA1C 5.8 (H) 10/14/2017   Lab Results  Component Value Date   CHOL 145 10/14/2017  HDL 34 (L) 10/14/2017   LDLCALC 88 10/14/2017   TRIG 116 10/14/2017   CHOLHDL 4.3 10/14/2017    Significant Diagnostic Results in last 30 days:  No results found.  Assessment and Plan  Hyperlipidemia Patient is on protocol, and occasionally 84 years old she does not need to be on Pravachol; will DC Pravachol  Hypothyroidism TSH 2.146; continue Synthroid 50 g by mouth daily  HTN (hypertension) Controlled; continue Cardizem CD 240 mg daily and metoprolol 25 mg by mouth twice a day    Melena Hayes D. Lyn Hollingshead, MD

## 2017-12-11 ENCOUNTER — Encounter: Payer: Self-pay | Admitting: Internal Medicine

## 2017-12-11 NOTE — Assessment & Plan Note (Signed)
Controlled; continue Cardizem CD 240 mg daily and metoprolol 25 mg by mouth twice a day

## 2017-12-11 NOTE — Assessment & Plan Note (Signed)
TSH 2.146; continue Synthroid 50 g by mouth daily

## 2017-12-11 NOTE — Assessment & Plan Note (Signed)
Patient is on protocol, and occasionally 84 years old she does not need to be on Pravachol; will DC Pravachol

## 2017-12-23 IMAGING — CT CT HEAD W/O CM
3 of 4 series · 15 of 47 positions shown, 18 images · non-contrast
Comparison: 07/21/2012

CLINICAL DATA: Mental status changes.

EXAM:
CT HEAD WITHOUT CONTRAST
TECHNIQUE: Contiguous axial images were obtained from the base of the skull
through the vertex without intravenous contrast.

[Series 2: head w/o · axial · non-contrast · 0.45mm/px · z∈[-104,+41]mm · 9 of 35 slices shown, 12 images]
[im 3/35  brain]
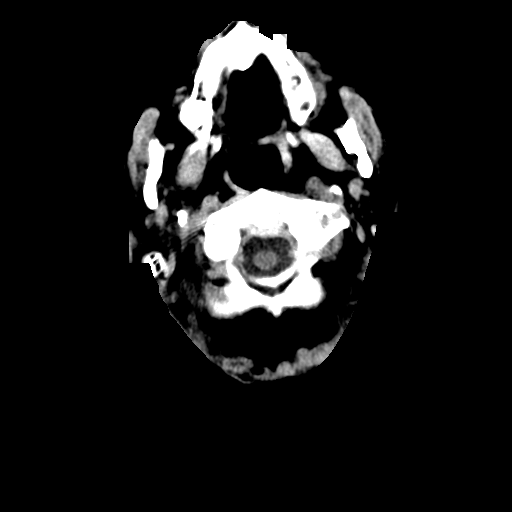
[im 3/35  bone]
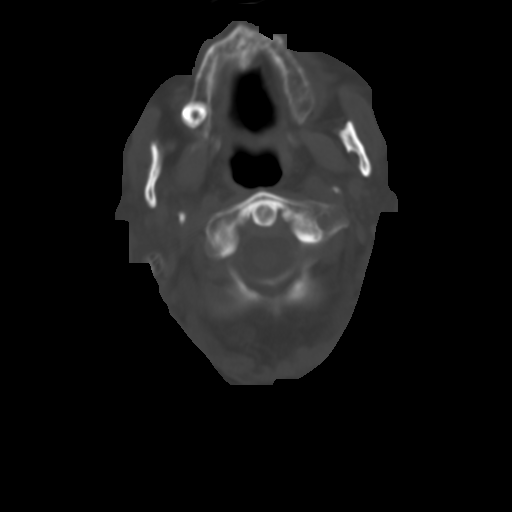
[im 8/35  brain]
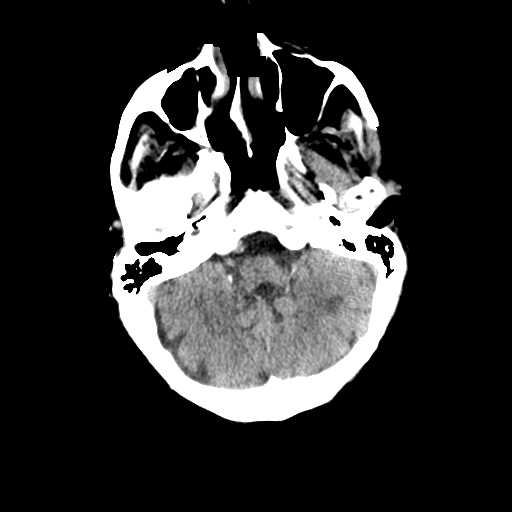
[im 10/35  brain]
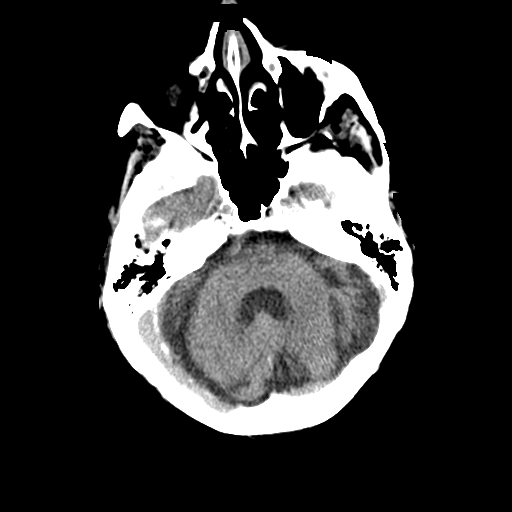
[im 15/35  brain]
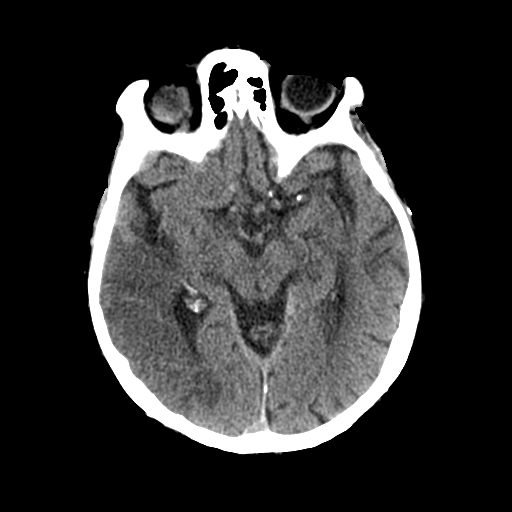
[im 18/35  brain]
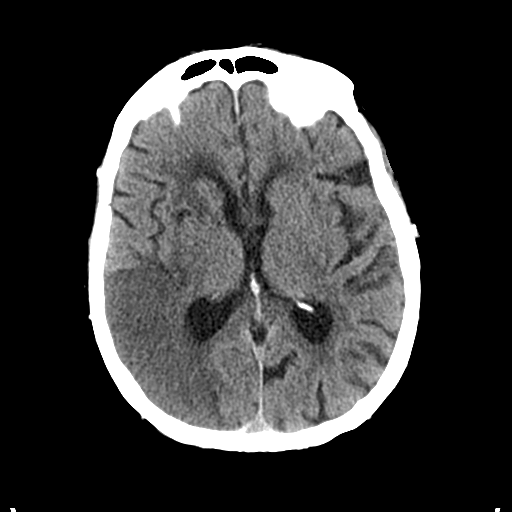
[im 18/35  bone]
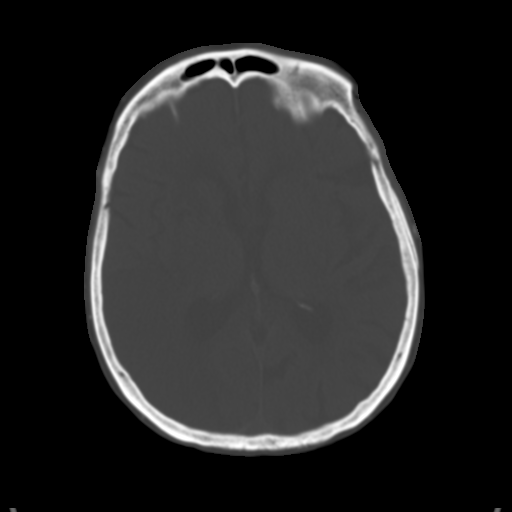
[im 20/35  brain]
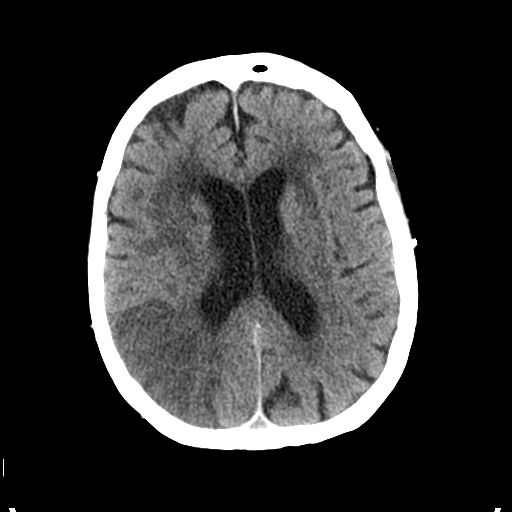
[im 25/35  brain]
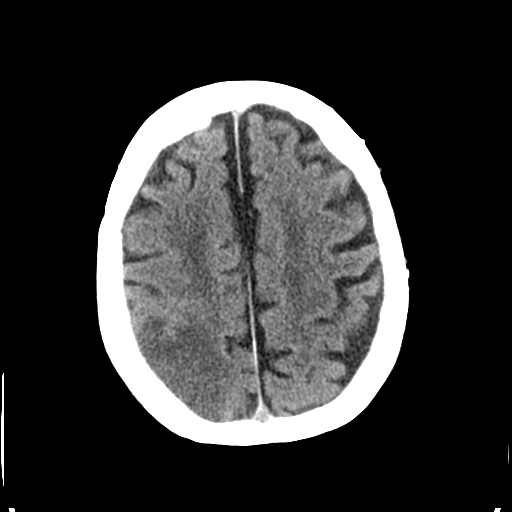
[im 27/35  brain]
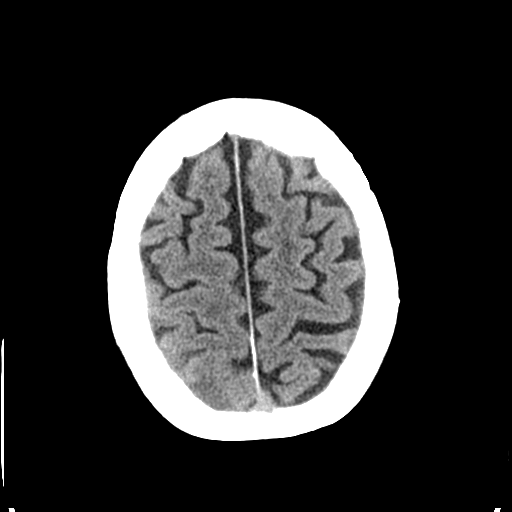
[im 32/35  brain]
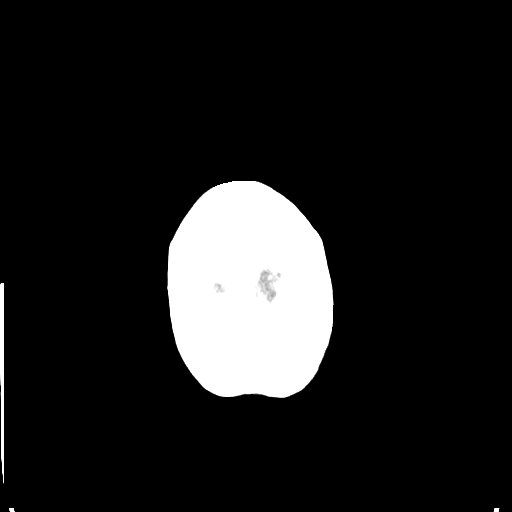
[im 32/35  bone]
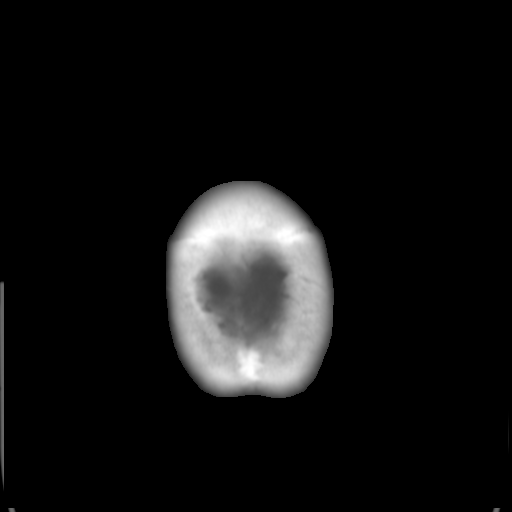

[Series 5: coronal · coronal · 0.33mm/px · 3 of 69 slices shown]
[im 23/69  brain]
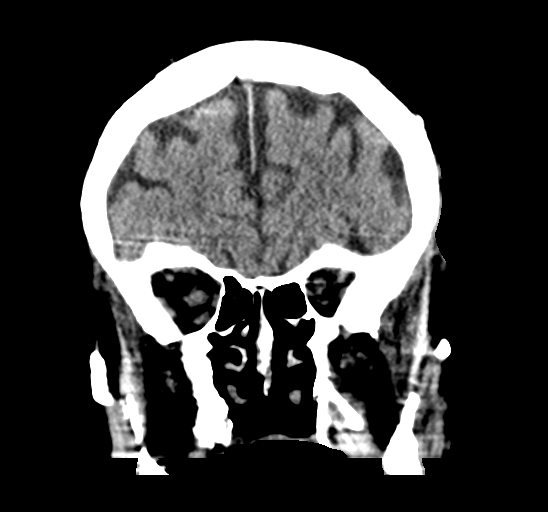
[im 31/69  brain]
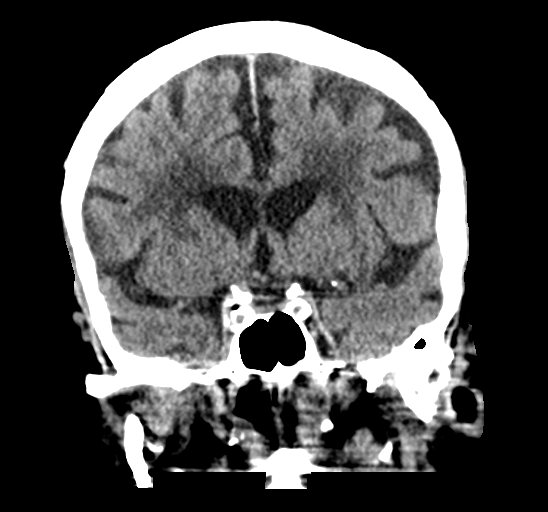
[im 38/69  brain]
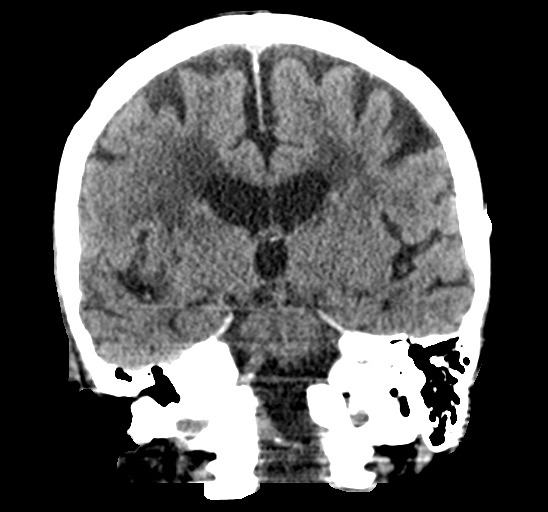

[Series 6: sagittal · sagittal · 0.34mm/px · 3 of 52 slices shown]
[im 18/52  brain]
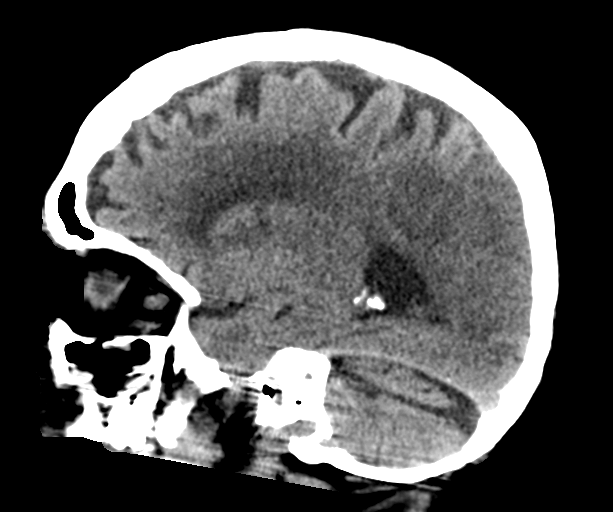
[im 26/52  brain]
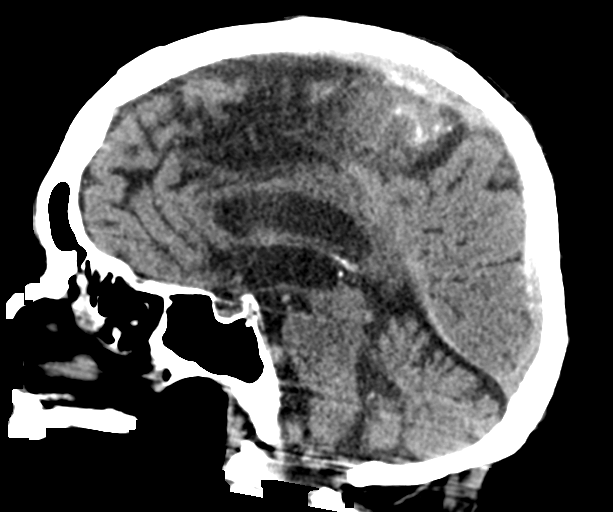
[im 35/52  brain]
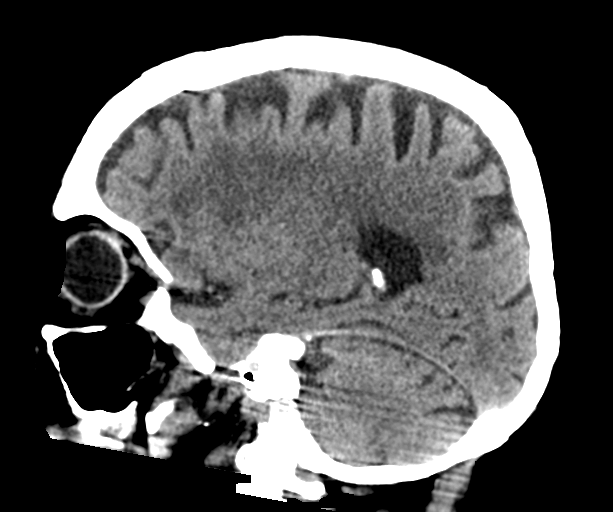

[15 of 47 positions shown; findings below may reference images not displayed]

FINDINGS: Brain: Moderate low density in the periventricular white matter
likely related to small vessel disease. Cortically based infarct
involving the posterior right MCA distribution, including on image
18/series 2. Sulci effacement. No midline shift. Expected cerebral
and cerebellar volume loss. No hydrocephalus, hemorrhage,
intra-axial, or extra-axial fluid collection.

Vascular: Intracranial atherosclerosis.

Skull: Normal

Sinuses/Orbits: Normal imaged portions of the orbits and globes.
Clear paranasal sinuses and mastoid air cells.

Other: None.
IMPRESSION: 1. Right posterior middle cerebral artery distribution cortically
based infarct, favored to be subacute. Sulci effacement but no
significant midline shift.
2.  Cerebral atrophy and small vessel ischemic change.
These results were called by telephone at the time of interpretation
on 10/13/2017 at [DATE] to Dr. LATORIA SHALOM , who verbally
acknowledged these results.

## 2018-01-05 ENCOUNTER — Non-Acute Institutional Stay (SKILLED_NURSING_FACILITY): Payer: Medicare Other | Admitting: Internal Medicine

## 2018-01-05 DIAGNOSIS — Z8673 Personal history of transient ischemic attack (TIA), and cerebral infarction without residual deficits: Secondary | ICD-10-CM | POA: Diagnosis not present

## 2018-01-05 DIAGNOSIS — I25118 Atherosclerotic heart disease of native coronary artery with other forms of angina pectoris: Secondary | ICD-10-CM

## 2018-01-05 DIAGNOSIS — I4891 Unspecified atrial fibrillation: Secondary | ICD-10-CM | POA: Diagnosis not present

## 2018-01-08 ENCOUNTER — Encounter: Payer: Self-pay | Admitting: Internal Medicine

## 2018-01-08 DIAGNOSIS — Z8673 Personal history of transient ischemic attack (TIA), and cerebral infarction without residual deficits: Secondary | ICD-10-CM | POA: Insufficient documentation

## 2018-01-08 DIAGNOSIS — I2510 Atherosclerotic heart disease of native coronary artery without angina pectoris: Secondary | ICD-10-CM | POA: Insufficient documentation

## 2018-01-08 DIAGNOSIS — I4891 Unspecified atrial fibrillation: Secondary | ICD-10-CM | POA: Insufficient documentation

## 2018-01-08 NOTE — Progress Notes (Signed)
Location:  [facility]   Place of Service:  SNF [phone]) Provider: [name] MD   Patient Care Team: Johny Blamer, MD as PCP - General (Family Medicine)  Extended Emergency Contact Information Primary Emergency Contact: Eldridge Dace of [place] Home Phone: [phone] Relation: Son Secondary Emergency Contact: Gregery Na [place] of [place] Home Phone: [phone] Relation: Relative    Allergies: Sulfa antibiotics  Chief Complaint  Patient presents with  . Medical Management of Chronic Issues    HPI: Patient is 84 y.o. female who is being seen for routine issues of atrial fibrillation, status post stroke, and coronary artery disease.  Past Medical History:  Diagnosis Date  . Acute kidney injury (HCC)   . Acute lower UTI 10/13/2017  . Acute renal failure superimposed on stage 3 chronic kidney disease (HCC) 10/13/2017  . Acute right MCA stroke (HCC) 10/13/2017  . Angina at rest [facility])   . Aortic aneurysm (HCC)   . Coronary artery disease   . Dysphagia   . Fracture of left humerus 07/21/2012  . HTN (hypertension) 07/21/2012  . Hypertension   . Hypothyroid   . Hypothyroidism 07/21/2012  . Toxic metabolic encephalopathy 10/13/2017    Past Surgical History:  Procedure Laterality Date  . ABDOMINAL HYSTERECTOMY    . BLADDER SURGERY    . CAROTID ARTERY ANGIOPLASTY    . CATARACT EXTRACTION      Allergies as of 01/05/2018      Reactions   Sulfa Antibiotics    unknown      Medication List        Accurate as of 01/05/18 11:59 PM. Always use your most recent med list.          diltiazem 240 MG 24 hr capsule Commonly known as:  CARDIZEM CD Take 1 capsule (240 mg total) by mouth daily.   ELIQUIS 2.5 MG Tabs tablet Generic drug:  apixaban Take 2.5 mg by mouth 2 (two) times daily.   ENSURE PLUS Liqd Take 237 mLs by mouth 3 (three) times daily between meals.   levothyroxine 50 MCG tablet Commonly  known as:  SYNTHROID, LEVOTHROID Take 50 mcg by mouth daily before breakfast.   metoprolol tartrate 25 MG tablet Commonly known as:  LOPRESSOR Take 1 tablet (25 mg total) by mouth 2 (two) times daily.   multivitamin with minerals Tabs tablet Take 1 tablet by mouth daily.   nitroGLYCERIN 0.4 MG SL tablet Commonly known as:  NITROSTAT Place 0.4 mg under the tongue every 5 (five) minutes as needed. For chest pain   pravastatin 40 MG tablet Commonly known as:  PRAVACHOL Take 40 mg by mouth daily.       No orders of the defined types were placed in this encounter.    There is no immunization history on file for this patient.  Social History   Tobacco Use  . Smoking status: Former Games developer  . Smokeless tobacco: Never Used  Substance Use Topics  . Alcohol use: No    Review of Systems  DATA OBTAINED: from nurse GENERAL:  no fevers, fatigue, appetite changes SKIN: No itching, rash HEENT: No complaint RESPIRATORY: No cough, wheezing, SOB CARDIAC: No chest pain, palpitations, lower extremity edema  GI: No abdominal pain, No N/V/D or constipation, No heartburn or reflux  GU: No dysuria, frequency or urgency, or incontinence  MUSCULOSKELETAL: No unrelieved bone/joint pain NEUROLOGIC: No headache, dizziness  PSYCHIATRIC: No overt  anxiety or sadness  Vitals:   01/08/18 2222  BP: 106/71  Pulse: 74  Resp: 18  Temp: (!) 97.5 F (36.4 C)   There is no height or weight on file to calculate BMI. Physical Exam  GENERAL APPEARANCE: Alert,  No acute distress  SKIN: No diaphoresis rash HEENT: Unremarkable RESPIRATORY: Breathing is even, unlabored. Lung sounds are clear   CARDIOVASCULAR: Heart RRR no murmurs, rubs or gallops. No peripheral edema  GASTROINTESTINAL: Abdomen is soft, non-tender, not distended w/ normal bowel sounds.  GENITOURINARY: Bladder non tender, not distended  MUSCULOSKELETAL: No abnormal joints or musculature NEUROLOGIC: Cranial nerves 2-12 grossly  intact. Moves all extremities PSYCHIATRIC: Mood and affect with limited communication , no behavioral issues  Patient Active Problem List   Diagnosis Date Noted  . Atrial fibrillation (HCC) 01/08/2018  . Status post stroke 01/08/2018  . Coronary artery disease 01/08/2018  . Acute respiratory failure with hypoxia (HCC) 10/23/2017  . E. coli UTI 10/23/2017  . Dysphagia   . Counseling regarding advanced care planning and goals of care   . Hyperlipidemia   . Acute kidney injury (HCC)   . Atrial fibrillation with RVR (HCC)   . Palliative care by specialist   . Acute right MCA stroke (HCC) 10/13/2017  . Acute lower UTI 10/13/2017  . Lactic acidosis 10/13/2017  . Toxic metabolic encephalopathy 10/13/2017  . Acute renal failure superimposed on stage 3 chronic kidney disease (HCC) 10/13/2017  . Fracture of left humerus 07/21/2012  . HTN (hypertension) 07/21/2012  . Hypothyroidism 07/21/2012  . Aortic aneurysm (HCC) 07/21/2012    CMP     Component Value Date/Time   NA 145 10/26/2017   K 4.1 10/26/2017   CL 106 10/18/2017 0242   CO2 21 (L) 10/18/2017 0242   GLUCOSE 101 (H) 10/18/2017 0242   BUN 20 10/26/2017   CREATININE 0.6 10/26/2017   CREATININE 1.19 (H) 10/18/2017 0242   CALCIUM 8.7 (L) 10/18/2017 0242   PROT 7.7 10/13/2017 1243   ALBUMIN 4.0 10/13/2017 1243   AST 64 (H) 10/13/2017 1243   ALT 72 (H) 10/13/2017 1243   ALKPHOS 111 10/13/2017 1243   BILITOT 1.3 (H) 10/13/2017 1243   GFRNONAA 36 (L) 10/18/2017 0242   GFRAA 42 (L) 10/18/2017 0242   Recent Labs    10/16/17 0615 10/17/17 0223 10/18/17 0242 10/26/17  NA 147* 151* 146* 145  K 3.7 3.1* 3.7 4.1  CL 114* 113* 106  --   CO2 16* 23 21*  --   GLUCOSE 91 117* 101*  --   BUN 48* 45* 43* 20  CREATININE 1.42* 1.32* 1.19* 0.6  CALCIUM 8.2* 8.9 8.7*  --    Recent Labs    10/13/17 1243  AST 64*  ALT 72*  ALKPHOS 111  BILITOT 1.3*  PROT 7.7  ALBUMIN 4.0   Recent Labs    10/13/17 1243 10/14/17 0226  10/15/17 0303 10/16/17 0615 10/26/17  WBC 8.8 12.9* 11.1* 11.0* 9.0  NEUTROABS 7.3  --   --   --   --   HGB 12.0 11.5* 12.5 11.8* 15.0  HCT 36.8 36.6 38.3 38.4 48*  MCV 97.4 98.7 98.5 99.0  --   PLT 289 232 187 172 326   Recent Labs    10/14/17 0226  CHOL 145  LDLCALC 88  TRIG 116   No results found for: [facility] Lab Results  Component Value Date   TSH 2.146 10/13/2017   Lab Results  Component Value Date  HGBA1C 5.8 (H) 10/14/2017   Lab Results  Component Value Date   CHOL 145 10/14/2017   HDL 34 (L) 10/14/2017   LDLCALC 88 10/14/2017   TRIG 116 10/14/2017   CHOLHDL 4.3 10/14/2017    Significant Diagnostic Results in last 30 days:  No results found.  Assessment and Plan  Atrial fibrillation (HCC) Stable; continue metoprolol 5 mg by mouth twice a day, diltiazem 240 mg by mouth daily and Eliquis 2.5 mg twice a day as prophylaxis  Status post stroke Stable; patient is on Eliquis 2.5 mg twice a day as prophylaxis and the patient is on statin  Coronary artery disease No reports of chest pain; continue Lopressor 25 mg twice a day, Eliquis 2.5 mg twice a day, nitroglycerin sublingual when necessary, patient is on statin    Merrilee SeashoreAnne Kalany Diekmann, MD

## 2018-01-08 NOTE — Assessment & Plan Note (Signed)
Stable; continue metoprolol 5 mg by mouth twice a day, diltiazem 240 mg by mouth daily and Eliquis 2.5 mg twice a day as prophylaxis

## 2018-01-08 NOTE — Assessment & Plan Note (Signed)
Stable; patient is on Eliquis 2.5 mg twice a day as prophylaxis and the patient is on statin

## 2018-01-08 NOTE — Assessment & Plan Note (Signed)
No reports of chest pain; continue Lopressor 25 mg twice a day, Eliquis 2.5 mg twice a day, nitroglycerin sublingual when necessary, patient is on statin

## 2018-01-27 ENCOUNTER — Non-Acute Institutional Stay (SKILLED_NURSING_FACILITY): Payer: Medicare Other | Admitting: Internal Medicine

## 2018-01-27 DIAGNOSIS — E785 Hyperlipidemia, unspecified: Secondary | ICD-10-CM

## 2018-01-27 DIAGNOSIS — I10 Essential (primary) hypertension: Secondary | ICD-10-CM | POA: Diagnosis not present

## 2018-01-27 DIAGNOSIS — E034 Atrophy of thyroid (acquired): Secondary | ICD-10-CM

## 2018-01-31 ENCOUNTER — Encounter: Payer: Self-pay | Admitting: Internal Medicine

## 2018-01-31 NOTE — Progress Notes (Signed)
Opened in error; Disregard.

## 2018-01-31 NOTE — Progress Notes (Signed)
[facility] Room Number: [phone] Place of Service:  SNF (31) Randon Goldsmith. Lyn Hollingshead, MD  Johny Blamer, MD  Patient Care Team: Johny Blamer, MD as PCP - General [facility])  Extended Emergency Contact Information Primary Emergency Contact: Eldridge Dace of [place] Home Phone: [phone] Relation: Son Secondary Emergency Contact: Gregery Na [place] of [place] Home Phone: [phone] Relation: Relative    Allergies: Sulfa antibiotics  Chief Complaint  Patient presents with  . Medical Management of Chronic Issues    Routine Visit    HPI: Patient is 84 y.o. female who is being seen for routine issues of hyperlipidemia, hypothyroidism, and hypertension.  Past Medical History:  Diagnosis Date  . Acute kidney injury (HCC)   . Acute lower UTI 10/13/2017  . Acute renal failure superimposed on stage 3 chronic kidney disease (HCC) 10/13/2017  . Acute right MCA stroke (HCC) 10/13/2017  . Angina at rest [facility])   . Aortic aneurysm (HCC)   . Coronary artery disease   . Dysphagia   . Fracture of left humerus 07/21/2012  . HTN (hypertension) 07/21/2012  . Hypertension   . Hypothyroid   . Hypothyroidism 07/21/2012  . Toxic metabolic encephalopathy 10/13/2017    Past Surgical History:  Procedure Laterality Date  . ABDOMINAL HYSTERECTOMY    . BLADDER SURGERY    . CAROTID ARTERY ANGIOPLASTY    . CATARACT EXTRACTION      Allergies as of 01/27/2018      Reactions   Sulfa Antibiotics    unknown      Medication List        Accurate as of 01/27/18 11:59 PM. Always use your most recent med list.          diltiazem 240 MG 24 hr capsule Commonly known as:  CARDIZEM CD Take 1 capsule (240 mg total) by mouth daily.   ELIQUIS 2.5 MG Tabs tablet Generic drug:  apixaban Take 2.5 mg by mouth 2 (two) times daily.   ENSURE PLUS Liqd Take 237 mLs by mouth 3 (three) times daily between meals.     levothyroxine 50 MCG tablet Commonly known as:  SYNTHROID, LEVOTHROID Take 50 mcg by mouth daily before breakfast.   metoprolol tartrate 25 MG tablet Commonly known as:  LOPRESSOR Take 1 tablet (25 mg total) by mouth 2 (two) times daily.   multivitamin with minerals Tabs tablet Take 1 tablet by mouth daily.   nitroGLYCERIN 0.4 MG SL tablet Commonly known as:  NITROSTAT Place 0.4 mg under the tongue every 5 (five) minutes as needed. For chest pain       No orders of the defined types were placed in this encounter.    There is no immunization history on file for this patient.  Social History   Tobacco Use  . Smoking status: Former Games developer  . Smokeless tobacco: Never Used  Substance Use Topics  . Alcohol use: No    Review of Systems  DATA OBTAINED: from patient-limited; nursing-no acute concerns GENERAL:  no fevers, fatigue, appetite changes SKIN: No itching, rash HEENT: No complaint RESPIRATORY: No cough, wheezing, SOB CARDIAC: No chest pain, palpitations, lower extremity edema  GI: No abdominal pain, No N/V/D or constipation, No heartburn or reflux  GU: No dysuria, frequency or urgency, or incontinence  MUSCULOSKELETAL: No unrelieved bone/joint pain NEUROLOGIC: No headache, dizziness  PSYCHIATRIC: No overt anxiety or sadness  Vitals:   01/27/18  1439  BP: 100/70  Pulse: 70  Resp: 18  Temp: (!) 97.3 F (36.3 C)  SpO2: 97%   Body mass index is 18.11 kg/m. Physical Exam  GENERAL APPEARANCE: Alert, No acute distress  SKIN: No diaphoresis rash HEENT: Unremarkable RESPIRATORY: Breathing is even, unlabored. Lung sounds are clear   CARDIOVASCULAR: Heart RRR no murmurs, rubs or gallops. No peripheral edema  GASTROINTESTINAL: Abdomen is soft, non-tender, not distended w/ normal bowel sounds.  GENITOURINARY: Bladder non tender, not distended  MUSCULOSKELETAL: No abnormal joints or musculature NEUROLOGIC: Cranial nerves 2-12 grossly intact. Moves all  extremities PSYCHIATRIC: Mood and affect- evaluation hampered by limited communication  Patient Active Problem List   Diagnosis Date Noted  . Atrial fibrillation (HCC) 01/08/2018  . Status post stroke 01/08/2018  . Coronary artery disease 01/08/2018  . Acute respiratory failure with hypoxia (HCC) 10/23/2017  . E. coli UTI 10/23/2017  . Dysphagia   . Counseling regarding advanced care planning and goals of care   . Hyperlipidemia   . Acute kidney injury (HCC)   . Atrial fibrillation with RVR (HCC)   . Palliative care by specialist   . Acute right MCA stroke (HCC) 10/13/2017  . Acute lower UTI 10/13/2017  . Lactic acidosis 10/13/2017  . Toxic metabolic encephalopathy 10/13/2017  . Acute renal failure superimposed on stage 3 chronic kidney disease (HCC) 10/13/2017  . Fracture of left humerus 07/21/2012  . HTN (hypertension) 07/21/2012  . Hypothyroidism 07/21/2012  . Aortic aneurysm (HCC) 07/21/2012    CMP     Component Value Date/Time   NA 145 10/26/2017   K 4.1 10/26/2017   CL 106 10/18/2017 0242   CO2 21 (L) 10/18/2017 0242   GLUCOSE 101 (H) 10/18/2017 0242   BUN 20 10/26/2017   CREATININE 0.6 10/26/2017   CREATININE 1.19 (H) 10/18/2017 0242   CALCIUM 8.7 (L) 10/18/2017 0242   PROT 7.7 10/13/2017 1243   ALBUMIN 4.0 10/13/2017 1243   AST 64 (H) 10/13/2017 1243   ALT 72 (H) 10/13/2017 1243   ALKPHOS 111 10/13/2017 1243   BILITOT 1.3 (H) 10/13/2017 1243   GFRNONAA 36 (L) 10/18/2017 0242   GFRAA 42 (L) 10/18/2017 0242   Recent Labs    10/16/17 0615 10/17/17 0223 10/18/17 0242 10/26/17  NA 147* 151* 146* 145  K 3.7 3.1* 3.7 4.1  CL 114* 113* 106  --   CO2 16* 23 21*  --   GLUCOSE 91 117* 101*  --   BUN 48* 45* 43* 20  CREATININE 1.42* 1.32* 1.19* 0.6  CALCIUM 8.2* 8.9 8.7*  --    Recent Labs    10/13/17 1243  AST 64*  ALT 72*  ALKPHOS 111  BILITOT 1.3*  PROT 7.7  ALBUMIN 4.0   Recent Labs    10/13/17 1243 10/14/17 0226 10/15/17 0303 10/16/17 0615  10/26/17  WBC 8.8 12.9* 11.1* 11.0* 9.0  NEUTROABS 7.3  --   --   --   --   HGB 12.0 11.5* 12.5 11.8* 15.0  HCT 36.8 36.6 38.3 38.4 48*  MCV 97.4 98.7 98.5 99.0  --   PLT 289 232 187 172 326   Recent Labs    10/14/17 0226  CHOL 145  LDLCALC 88  TRIG 116   No results found for: [facility] Lab Results  Component Value Date   TSH 2.146 10/13/2017   Lab Results  Component Value Date   HGBA1C 5.8 (H) 10/14/2017   Lab Results  Component Value Date  CHOL 145 10/14/2017   HDL 34 (L) 10/14/2017   LDLCALC 88 10/14/2017   TRIG 116 10/14/2017   CHOLHDL 4.3 10/14/2017    Significant Diagnostic Results in last 30 days:  No results found.  Assessment and Plan  Hypothyroidism Patient on Synthroid 50 mcg daily, with TSH 2.146; continue current regimen  Hyperlipidemia Pravachol DC'd secondary to age: We will monitor  HTN (hypertension) Controlled; continue metoprolol 25 mg twice daily and diltiazem to 40 mg daily    Kyria Bumgardner D. Lyn HollingsheadAlexander, MD

## 2018-02-04 ENCOUNTER — Encounter: Payer: Self-pay | Admitting: Internal Medicine

## 2018-02-04 NOTE — Assessment & Plan Note (Signed)
Pravachol DC'd secondary to age: We will monitor

## 2018-02-04 NOTE — Assessment & Plan Note (Signed)
Controlled; continue metoprolol 25 mg twice daily and diltiazem to 40 mg daily

## 2018-02-04 NOTE — Assessment & Plan Note (Signed)
Patient on Synthroid 50 mcg daily, with TSH 2.146; continue current regimen

## 2018-03-08 LAB — VITAMIN D 25 HYDROXY (VIT D DEFICIENCY, FRACTURES): VIT D 25 HYDROXY: 54.55

## 2018-03-09 ENCOUNTER — Non-Acute Institutional Stay (SKILLED_NURSING_FACILITY): Payer: Medicare Other | Admitting: Internal Medicine

## 2018-03-09 ENCOUNTER — Encounter: Payer: Self-pay | Admitting: Internal Medicine

## 2018-03-09 DIAGNOSIS — I4891 Unspecified atrial fibrillation: Secondary | ICD-10-CM

## 2018-03-09 DIAGNOSIS — I25118 Atherosclerotic heart disease of native coronary artery with other forms of angina pectoris: Secondary | ICD-10-CM

## 2018-03-09 DIAGNOSIS — Z8673 Personal history of transient ischemic attack (TIA), and cerebral infarction without residual deficits: Secondary | ICD-10-CM | POA: Diagnosis not present

## 2018-03-09 NOTE — Progress Notes (Signed)
[facility] Room Number: [phone] Place of Service:  SNF [phone]  Provider: Randon GoldsmithAnne D. Lyn HollingsheadAlexander, MD  Johny BlamerHarris, William, MD  Patient Care Team: Johny BlamerHarris, William, MD as PCP - General [facility](Family Medicine)  Extended Emergency Contact Information Primary Emergency Contact: Eldridge DaceGibson,Ed          [place] Home Phone: [phone] Relation: Son Secondary Emergency Contact: Gregery NaGibson,Vickie  [place] Home Phone: [phone] Relation: Relative    Allergies: Sulfa antibiotics  Chief Complaint  Patient presents with  . Medical Management of Chronic Issues    HPI: Patient is 84 y.o. female who is being seen for routine issues of status post stroke, coronary artery disease, and atrial fibrillation.  Past Medical History:  Diagnosis Date  . Acute kidney injury (HCC)   . Acute lower UTI 10/13/2017  . Acute renal failure superimposed on stage 3 chronic kidney disease (HCC) 10/13/2017  . Acute right MCA stroke (HCC) 10/13/2017  . Angina at rest [facility](HCC)   . Aortic aneurysm (HCC)   . Coronary artery disease   . Dysphagia   . Fracture of left humerus 07/21/2012  . HTN (hypertension) 07/21/2012  . Hypertension   . Hypothyroid   . Hypothyroidism 07/21/2012  . Toxic metabolic encephalopathy 10/13/2017    Past Surgical History:  Procedure Laterality Date  . ABDOMINAL HYSTERECTOMY    . BLADDER SURGERY    . CAROTID ARTERY ANGIOPLASTY    . CATARACT EXTRACTION      Allergies as of 03/09/2018      Reactions   Sulfa Antibiotics    unknown      Medication List        Accurate as of 03/09/18 11:59 PM. Always use your most recent med list.          DIALYVITE VITAMIN D3 MAX [id number] units Tabs Generic drug:  Cholecalciferol Take 1 capsule by mouth every 30 (thirty) days.   diltiazem 240 MG 24 hr capsule Commonly known as:  CARDIZEM CD Take 1 capsule (240 mg total) by mouth daily.   ELIQUIS 2.5 MG Tabs tablet Generic drug:   apixaban Take 2.5 mg by mouth 2 (two) times daily.   ENSURE PLUS Liqd Take 237 mLs by mouth 3 (three) times daily between meals.   levothyroxine 50 MCG tablet Commonly known as:  SYNTHROID, LEVOTHROID Take 50 mcg by mouth daily before breakfast.   metoprolol tartrate 25 MG tablet Commonly known as:  LOPRESSOR Take 1 tablet (25 mg total) by mouth 2 (two) times daily.   multivitamin with minerals Tabs tablet Take 1 tablet by mouth daily.   nitroGLYCERIN 0.4 MG SL tablet Commonly known as:  NITROSTAT Place 0.4 mg under the tongue every 5 (five) minutes as needed. For chest pain   UNABLE TO FIND 60 mLs 3 (three) times daily. Med Name: Medpass       No orders of the defined types were placed in this encounter.    There is no immunization history on file for this patient.  Social History   Tobacco Use  . Smoking status: Former Games developermoker  . Smokeless tobacco: Never Used  Substance Use Topics  . Alcohol use: No    Review of Systems  DATA OBTAINED: from nurse GENERAL:  no fevers, fatigue, appetite changes SKIN: No itching, rash HEENT: No complaint RESPIRATORY: No cough, wheezing, SOB CARDIAC: No chest pain, palpitations, lower extremity edema  GI: No abdominal pain, No N/V/D or constipation, No  heartburn or reflux  GU: No dysuria, frequency or urgency, or incontinence  MUSCULOSKELETAL: No unrelieved bone/joint pain NEUROLOGIC: No headache, dizziness  PSYCHIATRIC: No overt anxiety or sadness  Vitals:   03/09/18 1359  BP: 120/64  Pulse: 64  Resp: 18  Temp: (!) 97.1 F (36.2 C)   Body mass index is 18.59 kg/m. Physical Exam  GENERAL APPEARANCE: Alert, conversant, No acute distress  SKIN: No diaphoresis rash HEENT: Unremarkable RESPIRATORY: Breathing is even, unlabored. Lung sounds are clear   CARDIOVASCULAR: Heart RRR no murmurs, rubs or gallops. No peripheral edema  GASTROINTESTINAL: Abdomen is soft, non-tender, not distended w/ normal bowel sounds.    GENITOURINARY: Bladder non tender, not distended  MUSCULOSKELETAL: No abnormal joints or musculature NEUROLOGIC: Cranial nerves 2-12 grossly intact. Moves all extremities PSYCHIATRIC: Mood and affect with limited communication, no behavioral issues  Patient Active Problem List   Diagnosis Date Noted  . Atrial fibrillation (HCC) 01/08/2018  . Status post stroke 01/08/2018  . Coronary artery disease 01/08/2018  . Acute respiratory failure with hypoxia (HCC) 10/23/2017  . E. coli UTI 10/23/2017  . Dysphagia   . Counseling regarding advanced care planning and goals of care   . Hyperlipidemia   . Acute kidney injury (HCC)   . Atrial fibrillation with RVR (HCC)   . Palliative care by specialist   . Acute right MCA stroke (HCC) 10/13/2017  . Acute lower UTI 10/13/2017  . Lactic acidosis 10/13/2017  . Toxic metabolic encephalopathy 10/13/2017  . Acute renal failure superimposed on stage 3 chronic kidney disease (HCC) 10/13/2017  . Fracture of left humerus 07/21/2012  . HTN (hypertension) 07/21/2012  . Hypothyroidism 07/21/2012  . Aortic aneurysm (HCC) 07/21/2012    CMP     Component Value Date/Time   NA 145 10/26/2017   K 4.1 10/26/2017   CL 106 10/18/2017 0242   CO2 21 (L) 10/18/2017 0242   GLUCOSE 101 (H) 10/18/2017 0242   BUN 20 10/26/2017   CREATININE 0.6 10/26/2017   CREATININE 1.19 (H) 10/18/2017 0242   CALCIUM 8.7 (L) 10/18/2017 0242   PROT 7.7 10/13/2017 1243   ALBUMIN 4.0 10/13/2017 1243   AST 64 (H) 10/13/2017 1243   ALT 72 (H) 10/13/2017 1243   ALKPHOS 111 10/13/2017 1243   BILITOT 1.3 (H) 10/13/2017 1243   GFRNONAA 36 (L) 10/18/2017 0242   GFRAA 42 (L) 10/18/2017 0242   Recent Labs    10/16/17 0615 10/17/17 0223 10/18/17 0242 10/26/17  NA 147* 151* 146* 145  K 3.7 3.1* 3.7 4.1  CL 114* 113* 106  --   CO2 16* 23 21*  --   GLUCOSE 91 117* 101*  --   BUN 48* 45* 43* 20  CREATININE 1.42* 1.32* 1.19* 0.6  CALCIUM 8.2* 8.9 8.7*  --    Recent Labs     10/13/17 1243  AST 64*  ALT 72*  ALKPHOS 111  BILITOT 1.3*  PROT 7.7  ALBUMIN 4.0   Recent Labs    10/13/17 1243 10/14/17 0226 10/15/17 0303 10/16/17 0615 10/26/17  WBC 8.8 12.9* 11.1* 11.0* 9.0  NEUTROABS 7.3  --   --   --   --   HGB 12.0 11.5* 12.5 11.8* 15.0  HCT 36.8 36.6 38.3 38.4 48*  MCV 97.4 98.7 98.5 99.0  --   PLT 289 232 187 172 326   Recent Labs    10/14/17 0226  CHOL 145  LDLCALC 88  TRIG 116   No results found for: [facility]  Lab Results  Component Value Date   TSH 2.146 10/13/2017   Lab Results  Component Value Date   HGBA1C 5.8 (H) 10/14/2017   Lab Results  Component Value Date   CHOL 145 10/14/2017   HDL 34 (L) 10/14/2017   LDLCALC 88 10/14/2017   TRIG 116 10/14/2017   CHOLHDL 4.3 10/14/2017    Significant Diagnostic Results in last 30 days:  No results found.  Assessment and Plan  Status post stroke Stable; patient is on Eliquis 2.5 mg twice daily as prophylaxis; due to age patient is not on statin; patient's blood pressure is controlled  Coronary artery disease No reports of problems; plan to continue Eliquis 2.5 twice daily, Lopressor 25 mg twice daily and sublingual nitroglycerin as needed; patient's statin has been stopped due to age  Atrial fibrillation (HCC) No problems; continue diltiazem 240 mg daily, Toprol 25 mg twice daily and Eliquis 2.5 mg twice daily    Antonea Gaut D. Lyn Hollingshead, MD

## 2018-03-16 ENCOUNTER — Non-Acute Institutional Stay (SKILLED_NURSING_FACILITY): Payer: Medicare Other

## 2018-03-16 DIAGNOSIS — Z0000 Encounter for general adult medical examination without abnormal findings: Secondary | ICD-10-CM

## 2018-03-16 NOTE — Patient Instructions (Signed)
Jennifer Zavala , Thank you for taking time to come for your Medicare Wellness Visit. I appreciate your ongoing commitment to your health goals. Please review the following plan we discussed and let me know if I can assist you in the future.   Screening recommendations/referrals: Colonoscopy excluded Mammogram excluded Bone Density excluded Recommended yearly ophthalmology/optometry visit for glaucoma screening and checkup Recommended yearly dental visit for hygiene and checkup  Vaccinations: Influenza vaccine up to date, due 2019 fall season Pneumococcal vaccine due, ordered Tdap vaccine due, ordered Shingles vaccine not in records    Advanced directives: Need a copy for chart  Conditions/risks identified: none  Next appointment: Dr. Lyn Hollingshead makes rounds   Preventive Care 65 Years and Older, Female Preventive care refers to lifestyle choices and visits with your health care provider that can promote health and wellness. What does preventive care include?  A yearly physical exam. This is also called an annual well check.  Dental exams once or twice a year.  Routine eye exams. Ask your health care provider how often you should have your eyes checked.  Personal lifestyle choices, including:  Daily care of your teeth and gums.  Regular physical activity.  Eating a healthy diet.  Avoiding tobacco and drug use.  Limiting alcohol use.  Practicing safe sex.  Taking low-dose aspirin every day.  Taking vitamin and mineral supplements as recommended by your health care provider. What happens during an annual well check? The services and screenings done by your health care provider during your annual well check will depend on your age, overall health, lifestyle risk factors, and family history of disease. Counseling  Your health care provider may ask you questions about your:  Alcohol use.  Tobacco use.  Drug use.  Emotional well-being.  Home and relationship  well-being.  Sexual activity.  Eating habits.  History of falls.  Memory and ability to understand (cognition).  Work and work Astronomer.  Reproductive health. Screening  You may have the following tests or measurements:  Height, weight, and BMI.  Blood pressure.  Lipid and cholesterol levels. These may be checked every 5 years, or more frequently if you are over 50 years old.  Skin check.  Lung cancer screening. You may have this screening every year starting at age 1 if you have a 30-pack-year history of smoking and currently smoke or have quit within the past 15 years.  Fecal occult blood test (FOBT) of the stool. You may have this test every year starting at age 41.  Flexible sigmoidoscopy or colonoscopy. You may have a sigmoidoscopy every 5 years or a colonoscopy every 10 years starting at age 18.  Hepatitis C blood test.  Hepatitis B blood test.  Sexually transmitted disease (STD) testing.  Diabetes screening. This is done by checking your blood sugar (glucose) after you have not eaten for a while (fasting). You may have this done every 1-3 years.  Bone density scan. This is done to screen for osteoporosis. You may have this done starting at age 24.  Mammogram. This may be done every 1-2 years. Talk to your health care provider about how often you should have regular mammograms. Talk with your health care provider about your test results, treatment options, and if necessary, the need for more tests. Vaccines  Your health care provider may recommend certain vaccines, such as:  Influenza vaccine. This is recommended every year.  Tetanus, diphtheria, and acellular pertussis (Tdap, Td) vaccine. You may need a Td booster every 10 years.  Zoster vaccine. You may need this after age 56.  Pneumococcal 13-valent conjugate (PCV13) vaccine. One dose is recommended after age 76.  Pneumococcal polysaccharide (PPSV23) vaccine. One dose is recommended after age  43. Talk to your health care provider about which screenings and vaccines you need and how often you need them. This information is not intended to replace advice given to you by your health care provider. Make sure you discuss any questions you have with your health care provider. Document Released: 11/28/2015 Document Revised: 07/21/2016 Document Reviewed: 09/02/2015 Elsevier Interactive Patient Education  2017 [employer] Prevention in the Home Falls can cause injuries. They can happen to people of all ages. There are many things you can do to make your home safe and to help prevent falls. What can I do on the outside of my home?  Regularly fix the edges of walkways and driveways and fix any cracks.  Remove anything that might make you trip as you walk through a door, such as a raised step or threshold.  Trim any bushes or trees on the path to your home.  Use bright outdoor lighting.  Clear any walking paths of anything that might make someone trip, such as rocks or tools.  Regularly check to see if handrails are loose or broken. Make sure that both sides of any steps have handrails.  Any raised decks and porches should have guardrails on the edges.  Have any leaves, snow, or ice cleared regularly.  Use sand or salt on walking paths during winter.  Clean up any spills in your garage right away. This includes oil or grease spills. What can I do in the bathroom?  Use night lights.  Install grab bars by the toilet and in the tub and shower. Do not use towel bars as grab bars.  Use non-skid mats or decals in the tub or shower.  If you need to sit down in the shower, use a plastic, non-slip stool.  Keep the floor dry. Clean up any water that spills on the floor as soon as it happens.  Remove soap buildup in the tub or shower regularly.  Attach bath mats securely with double-sided non-slip rug tape.  Do not have throw rugs and other things on the floor that can make  you trip. What can I do in the bedroom?  Use night lights.  Make sure that you have a light by your bed that is easy to reach.  Do not use any sheets or blankets that are too big for your bed. They should not hang down onto the floor.  Have a firm chair that has side arms. You can use this for support while you get dressed.  Do not have throw rugs and other things on the floor that can make you trip. What can I do in the kitchen?  Clean up any spills right away.  Avoid walking on wet floors.  Keep items that you use a lot in easy-to-reach places.  If you need to reach something above you, use a strong step stool that has a grab bar.  Keep electrical cords out of the way.  Do not use floor polish or wax that makes floors slippery. If you must use wax, use non-skid floor wax.  Do not have throw rugs and other things on the floor that can make you trip. What can I do with my stairs?  Do not leave any items on the stairs.  Make sure that there are  handrails on both sides of the stairs and use them. Fix handrails that are broken or loose. Make sure that handrails are as long as the stairways.  Check any carpeting to make sure that it is firmly attached to the stairs. Fix any carpet that is loose or worn.  Avoid having throw rugs at the top or bottom of the stairs. If you do have throw rugs, attach them to the floor with carpet tape.  Make sure that you have a light switch at the top of the stairs and the bottom of the stairs. If you do not have them, ask someone to add them for you. What else can I do to help prevent falls?  Wear shoes that:  Do not have high heels.  Have rubber bottoms.  Are comfortable and fit you well.  Are closed at the toe. Do not wear sandals.  If you use a stepladder:  Make sure that it is fully opened. Do not climb a closed stepladder.  Make sure that both sides of the stepladder are locked into place.  Ask someone to hold it for you, if  possible.  Clearly mark and make sure that you can see:  Any grab bars or handrails.  First and last steps.  Where the edge of each step is.  Use tools that help you move around (mobility aids) if they are needed. These include:  Canes.  Walkers.  Scooters.  Crutches.  Turn on the lights when you go into a dark area. Replace any light bulbs as soon as they burn out.  Set up your furniture so you have a clear path. Avoid moving your furniture around.  If any of your floors are uneven, fix them.  If there are any pets around you, be aware of where they are.  Review your medicines with your doctor. Some medicines can make you feel dizzy. This can increase your chance of falling. Ask your doctor what other things that you can do to help prevent falls. This information is not intended to replace advice given to you by your health care provider. Make sure you discuss any questions you have with your health care provider. Document Released: 08/28/2009 Document Revised: 04/08/2016 Document Reviewed: 12/06/2014 Elsevier Interactive Patient Education  2017 [employer].

## 2018-03-16 NOTE — Progress Notes (Signed)
Subjective:   Jennifer Zavala is a 84 y.o. female who presents for Medicare Annual (Subsequent) preventive examination at [facility]  Last AWV-12/10/2016    Objective:     Vitals: BP 122/70 (BP Location: Left Arm, Patient Position: Sitting)   Pulse 66   Temp 98 F (36.7 C) (Oral)   Ht  (1.676 m)   Wt 115 lb (52.2 kg)   SpO2 95%   BMI 18.56 kg/m   Body mass index is 18.56 kg/m.  Advanced Directives 03/16/2018 03/09/2018 01/27/2018 12/05/2017 10/31/2017 10/19/2017 10/13/2017  Does Patient Have a Medical Advance Directive? No No Yes Yes Yes Yes No  Type of Advance Directive Out of facility DNR (pink MOST or yellow form) - Out of facility DNR (pink MOST or yellow form) Out of facility DNR (pink MOST or yellow form) Out of facility DNR (pink MOST or yellow form) Out of facility DNR (pink MOST or yellow form) -  Does patient want to make changes to medical advance directive? No - Patient declined - No - Patient declined - - - -  Would patient like information on creating a medical advance directive? - - - - - - No - Patient declined  Pre-existing out of facility DNR order (yellow form or pink MOST form) Yellow form placed in chart (order not valid for inpatient use) - Yellow form placed in chart (order not valid for inpatient use) Yellow form placed in chart (order not valid for inpatient use) Yellow form placed in chart (order not valid for inpatient use) Yellow form placed in chart (order not valid for inpatient use) -    Tobacco Social History   Tobacco Use  Smoking Status Former Smoker  Smokeless Tobacco Never Used     Counseling given: Not Answered   Clinical Intake:  Pre-visit preparation completed: No  Pain : No/denies pain     Nutritional Risks: None Diabetes: No  How often do you need to have someone help you when you read instructions, pamphlets, or other written materials from your doctor or pharmacy?: 3 - Sometimes  Interpreter Needed?:  No  Information entered by :: Tyron Russell, RN  Past Medical History:  Diagnosis Date  . Acute kidney injury (HCC)   . Acute lower UTI 10/13/2017  . Acute renal failure superimposed on stage 3 chronic kidney disease (HCC) 10/13/2017  . Acute right MCA stroke (HCC) 10/13/2017  . Angina at rest [facility])   . Aortic aneurysm (HCC)   . Coronary artery disease   . Dysphagia   . Fracture of left humerus 07/21/2012  . HTN (hypertension) 07/21/2012  . Hypertension   . Hypothyroid   . Hypothyroidism 07/21/2012  . Toxic metabolic encephalopathy 10/13/2017   Past Surgical History:  Procedure Laterality Date  . ABDOMINAL HYSTERECTOMY    . BLADDER SURGERY    . CAROTID ARTERY ANGIOPLASTY    . CATARACT EXTRACTION     No family history on file. Social History   Socioeconomic History  . Marital status: Widowed    Spouse name: Not on file  . Number of children: Not on file  . Years of education: Not on file  . Highest education level: Not on file  Occupational History  . Not on file  Social Needs  . Financial resource strain: Not hard at all  . Food insecurity:    Worry: Never true    Inability: Never true  . Transportation needs:    Medical: No  Non-medical: No  Tobacco Use  . Smoking status: Former Games developer  . Smokeless tobacco: Never Used  Substance and Sexual Activity  . Alcohol use: No  . Drug use: No  . Sexual activity: Never  Lifestyle  . Physical activity:    Days per week: 0 days    Minutes per session: 0 min  . Stress: Not at all  Relationships  . Social connections:    Talks on phone: Twice a week    Gets together: Twice a week    Attends religious service: Never    Active member of club or organization: No    Attends meetings of clubs or organizations: Never    Relationship status: Widowed  Other Topics Concern  . Not on file  Social History Narrative   Admitted to [facility] 10/18/17   Widowed   Former smoker   Alcohol none   DNR     Outpatient Encounter Medications as of 03/16/2018  Medication Sig  . apixaban (ELIQUIS) 2.5 MG TABS tablet Take 2.5 mg by mouth 2 (two) times daily.   . Cholecalciferol (DIALYVITE VITAMIN D3 MAX) 21308 units TABS Take 1 capsule by mouth every 30 (thirty) days.  Marland Kitchen diltiazem (CARDIZEM CD) 240 MG 24 hr capsule Take 1 capsule (240 mg total) by mouth daily.  Marland Kitchen ENSURE PLUS (ENSURE PLUS) LIQD Take 237 mLs by mouth 3 (three) times daily between meals.  Marland Kitchen levothyroxine (SYNTHROID, LEVOTHROID) 50 MCG tablet Take 50 mcg by mouth daily before breakfast.  . metoprolol tartrate (LOPRESSOR) 25 MG tablet Take 1 tablet (25 mg total) by mouth 2 (two) times daily.  . Multiple Vitamin (MULTIVITAMIN WITH MINERALS) TABS Take 1 tablet by mouth daily.  . nitroGLYCERIN (NITROSTAT) 0.4 MG SL tablet Place 0.4 mg under the tongue every 5 (five) minutes as needed. For chest pain  . UNABLE TO FIND 60 mLs 3 (three) times daily. Med Name: Medpass   No facility-administered encounter medications on file as of 03/16/2018.     Activities of Daily Living In your present state of health, do you have any difficulty performing the following activities: 03/16/2018 10/13/2017  Hearing? N Y  Vision? N N  Difficulty concentrating or making decisions? Malvin Johns  Walking or climbing stairs? Y Y  Dressing or bathing? Y Y  Doing errands, shopping? Malvin Johns  Preparing Food and eating ? Y -  Using the Toilet? Y -  In the past six months, have you accidently leaked urine? Y -  Do you have problems with loss of bowel control? Y -  Managing your Medications? Y -  Managing your Finances? Y -  Housekeeping or managing your Housekeeping? Y -  Some recent data might be hidden    Patient Care Team: Johny Blamer, MD as PCP - General (Family Medicine)    Assessment:   This is a routine wellness examination for Jennifer Zavala.  Exercise Activities and Dietary recommendations Current Exercise Habits: The patient does not participate in regular  exercise at present, Exercise limited by: orthopedic condition(s)  Goals    None      Fall Risk Fall Risk  03/16/2018  Falls in the past year? No   Is the patient's home free of loose throw rugs in walkways, pet beds, electrical cords, etc?   yes      Grab bars in the bathroom? yes      Handrails on the stairs?   yes      Adequate lighting?  yes   Depression Screen PHQ 2/9 Scores 03/16/2018  PHQ - 2 Score 0     Cognitive Function     6CIT Screen 03/16/2018  What Year? 0 points  What month? 3 points  What time? 0 points  Count back from 20 0 points  Months in reverse 4 points  Repeat phrase 10 points  Total Score 17     There is no immunization history on file for this patient.  Qualifies for Shingles Vaccine? Not in past records  Screening Tests Health Maintenance  Topic Date Due  . TETANUS/TDAP  07/05/201938  . DEXA SCAN  07/05/201984  . PNA vac Low Risk Adult (1 of 2 - PCV13) 07/05/201984  . INFLUENZA VACCINE  06/15/2018    Cancer Screenings: Lung: Low Dose CT Chest recommended if Age 41-80 years, 30 pack-year currently smoking OR have quit w/in 15years. Patient does not qualify. Breast:  Up to date on Mammogram? Yes   Up to date of Bone Density/Dexa? Yes Colorectal: up to dat  Additional Screenings:  Hepatitis C Screening: declined TDAP due-ordered PNA 13 due-ordered     Plan:    I have personally reviewed and addressed the Medicare Annual Wellness questionnaire and have noted the following in the patient's chart:  A. Medical and social history B. Use of alcohol, tobacco or illicit drugs  C. Current medications and supplements D. Functional ability and status E.  Nutritional status F.  Physical activity G. Advance directives H. List of other physicians I.  Hospitalizations, surgeries, and ER visits in previous 12 months J.  Vitals K. Screenings to include hearing, vision, cognitive, depression L. Referrals and appointments - none  In addition, I  have reviewed and discussed with patient certain preventive protocols, quality metrics, and best practice recommendations. A written personalized care plan for preventive services as well as general preventive health recommendations were provided to patient.  See attached scanned questionnaire for additional information.   Signed,   Tyron Russell, RN Nurse Health Advisor  Patient Concerns: None

## 2018-04-02 ENCOUNTER — Encounter: Payer: Self-pay | Admitting: Internal Medicine

## 2018-04-02 NOTE — Assessment & Plan Note (Signed)
No problems; continue diltiazem 240 mg daily, Toprol 25 mg twice daily and Eliquis 2.5 mg twice daily

## 2018-04-02 NOTE — Assessment & Plan Note (Signed)
No reports of problems; plan to continue Eliquis 2.5 twice daily, Lopressor 25 mg twice daily and sublingual nitroglycerin as needed; patient's statin has been stopped due to age

## 2018-04-02 NOTE — Assessment & Plan Note (Signed)
Stable; patient is on Eliquis 2.5 mg twice daily as prophylaxis; due to age patient is not on statin; patient's blood pressure is controlled

## 2018-04-07 ENCOUNTER — Non-Acute Institutional Stay (SKILLED_NURSING_FACILITY): Payer: Medicare Other | Admitting: Internal Medicine

## 2018-04-07 ENCOUNTER — Encounter: Payer: Self-pay | Admitting: Internal Medicine

## 2018-04-07 DIAGNOSIS — I4891 Unspecified atrial fibrillation: Secondary | ICD-10-CM

## 2018-04-07 DIAGNOSIS — I10 Essential (primary) hypertension: Secondary | ICD-10-CM | POA: Diagnosis not present

## 2018-04-07 DIAGNOSIS — E034 Atrophy of thyroid (acquired): Secondary | ICD-10-CM | POA: Diagnosis not present

## 2018-04-07 NOTE — Progress Notes (Signed)
Location:  [facility] Room Number: 407-P Place of Service:  SNF (31)  Jennifer Hanks, MD  Patient Care Team: Jennifer Hanks, MD as PCP - General (Internal Medicine)  Extended Emergency Contact Information Primary Emergency Contact: Eldridge Dace of [place] Home Phone: [phone] Relation: Son Secondary Emergency Contact: Gregery Na [place] of [place] Home Phone: [phone] Relation: Relative    Allergies: Sulfa antibiotics  Chief Complaint  Patient presents with  . Medical Management of Chronic Issues    Routine visit    HPI: Patient is 84 y.o. female who is being seen for routine issues of hypothyroidism, hypertension, and atrial fibrillation.  Past Medical History:  Diagnosis Date  . Acute kidney injury (HCC)   . Acute lower UTI 10/13/2017  . Acute renal failure superimposed on stage 3 chronic kidney disease (HCC) 10/13/2017  . Acute right MCA stroke (HCC) 10/13/2017  . Angina at rest [facility])   . Aortic aneurysm (HCC)   . Coronary artery disease   . Dysphagia   . Fracture of left humerus 07/21/2012  . HTN (hypertension) 07/21/2012  . Hypertension   . Hypothyroid   . Hypothyroidism 07/21/2012  . Toxic metabolic encephalopathy 10/13/2017    Past Surgical History:  Procedure Laterality Date  . ABDOMINAL HYSTERECTOMY    . BLADDER SURGERY    . CAROTID ARTERY ANGIOPLASTY    . CATARACT EXTRACTION      Allergies as of 04/07/2018      Reactions   Sulfa Antibiotics    unknown      Medication List        Accurate as of 04/07/18 11:59 PM. Always use your most recent med list.          DIALYVITE VITAMIN D3 MAX 72536 units Tabs Generic drug:  Cholecalciferol Take 1 capsule by mouth every 30 (thirty) days.   diltiazem 240 MG 24 hr capsule Commonly known as:  CARDIZEM CD Take 1 capsule (240 mg total) by mouth daily.   ELIQUIS 2.5 MG Tabs tablet Generic drug:  apixaban Take 2.5  mg by mouth 2 (two) times daily.   ENSURE PLUS Liqd Take 237 mLs by mouth 3 (three) times daily between meals.   levothyroxine 50 MCG tablet Commonly known as:  SYNTHROID, LEVOTHROID Take 50 mcg by mouth daily before breakfast.   metoprolol tartrate 25 MG tablet Commonly known as:  LOPRESSOR Take 1 tablet (25 mg total) by mouth 2 (two) times daily.   multivitamin with minerals Tabs tablet Take 1 tablet by mouth daily.   nitroGLYCERIN 0.4 MG SL tablet Commonly known as:  NITROSTAT Place 0.4 mg under the tongue every 5 (five) minutes as needed. For chest pain   UNABLE TO FIND 60 mLs 3 (three) times daily. Med Name: Medpass       No orders of the defined types were placed in this encounter.   Immunization History  Administered Date(s) Administered  . Pneumococcal Conjugate-13 07/16/2017  . Pneumococcal Polysaccharide-23 03/17/2018    Social History   Tobacco Use  . Smoking status: Former Games developer  . Smokeless tobacco: Never Used  Substance Use Topics  . Alcohol use: No    Review of Systems  DATA OBTAINED: from patient-limited; nursing-no acute concerns GENERAL:  no fevers, fatigue, appetite changes SKIN: No itching, rash HEENT: No complaint RESPIRATORY: No cough, wheezing, SOB CARDIAC: No chest pain, palpitations, lower extremity edema  GI: No abdominal pain, No N/V/D or constipation, No heartburn or reflux  GU: No dysuria, frequency or urgency, or incontinence  MUSCULOSKELETAL: No unrelieved bone/joint pain NEUROLOGIC: No headache, dizziness  PSYCHIATRIC: No overt anxiety or sadness  Vitals:   04/07/18 1508  BP: 116/64  Pulse: 78  Resp: 18  Temp: (!) 97.5 F (36.4 C)   Body mass index is 20.12 kg/m. Physical Exam  GENERAL APPEARANCE: Alert,  No acute distress  SKIN: No diaphoresis rash HEENT: Unre cold markable RESPIRATORY: Breathing is even, unlabored. Lung sounds are clear   CARDIOVASCULAR: Heart RRR no murmurs, rubs or gallops. No peripheral  edema  GASTROINTESTINAL: Abdomen is soft, non-tender, not distended w/ normal bowel sounds.  GENITOURINARY: Bladder non tender, not distended  MUSCULOSKELETAL: No abnormal joints or musculature NEUROLOGIC: Cranial nerves 2-12 grossly intact. Moves all extremities PSYCHIATRIC: Mood and affect - limited communication, no behavioral issues  Patient Active Problem List   Diagnosis Date Noted  . Atrial fibrillation (HCC) 01/08/2018  . Status post stroke 01/08/2018  . Coronary artery disease 01/08/2018  . Acute respiratory failure with hypoxia (HCC) 10/23/2017  . E. coli UTI 10/23/2017  . Dysphagia   . Counseling regarding advanced care planning and goals of care   . Hyperlipidemia   . Acute kidney injury (HCC)   . Atrial fibrillation with RVR (HCC)   . Palliative care by specialist   . Acute right MCA stroke (HCC) 10/13/2017  . Acute lower UTI 10/13/2017  . Lactic acidosis 10/13/2017  . Toxic metabolic encephalopathy 10/13/2017  . Acute renal failure superimposed on stage 3 chronic kidney disease (HCC) 10/13/2017  . Fracture of left humerus 07/21/2012  . HTN (hypertension) 07/21/2012  . Hypothyroidism 07/21/2012  . Aortic aneurysm (HCC) 07/21/2012    CMP     Component Value Date/Time   NA 141 05/04/2018   K 4.6 05/04/2018   CL 106 10/18/2017 0242   CO2 21 (L) 10/18/2017 0242   GLUCOSE 101 (H) 10/18/2017 0242   BUN 22 (A) 05/04/2018   CREATININE 0.80 05/04/2018   CALCIUM 8.7 05/04/2018   PROT 6.6 05/04/2018   ALBUMIN 3.4 05/04/2018   AST 17 05/04/2018   ALT 10 05/04/2018   ALKPHOS 75 05/04/2018   BILITOT 0.3 05/04/2018   GFRNONAA 60.85 05/04/2018   GFRAA 42 (L) 10/18/2017 0242   Recent Labs    10/16/17 0615 10/17/17 0223 10/18/17 0242 10/26/17 05/04/18  NA 147* 151* 146* 145 141  K 3.7 3.1* 3.7 4.1 4.6  CL 114* 113* 106  --   --   CO2 16* 23 21*  --   --   GLUCOSE 91 117* 101*  --   --   BUN 48* 45* 43* 20 22*  CREATININE 1.42* 1.32* 1.19* 0.6 0.80  CALCIUM  8.2* 8.9 8.7*  --  8.7   Recent Labs    10/13/17 1243 05/04/18  AST 64* 17  ALT 72* 10  ALKPHOS 111 75  BILITOT 1.3* 0.3  PROT 7.7 6.6  ALBUMIN 4.0 3.4   Recent Labs    10/13/17 1243  10/15/17 0303 10/16/17 0615 10/26/17 05/04/18  WBC 8.8   < > 11.1* 11.0* 9.0 6.6  10-20  NEUTROABS 7.3  --   --   --   --   --   HGB 12.0   < > 12.5 11.8* 15.0 12.3  HCT 36.8   < > 38.3 38.4 48* 38.6  MCV 97.4   < > 98.5 99.0  --  97.6  PLT 289   < > 187 172 326  --    < > = values in this interval not displayed.   Recent Labs    10/14/17 0226  CHOL 145  LDLCALC 88  TRIG 116   No results found for: [facility] Lab Results  Component Value Date   TSH 2.146 10/13/2017   Lab Results  Component Value Date   HGBA1C 5.8 (H) 10/14/2017   Lab Results  Component Value Date   CHOL 145 10/14/2017   HDL 34 (L) 10/14/2017   LDLCALC 88 10/14/2017   TRIG 116 10/14/2017   CHOLHDL 4.3 10/14/2017    Significant Diagnostic Results in last 30 days:  No results found.  Assessment and Plan  HTN (hypertension) Controlled; continue Cardizem 240 mg daily  Hypothyroidism Controlled; continue levothyroxine 50 mcg daily  Atrial fibrillation (HCC) No concerns; continue diltiazem 240 mg daily and prophylaxis with Eliquis 2.5 mg twice daily    Jennifer Zavala D. Lyn Hollingshead, MD

## 2018-05-01 ENCOUNTER — Non-Acute Institutional Stay (SKILLED_NURSING_FACILITY): Payer: Medicare Other | Admitting: Internal Medicine

## 2018-05-01 ENCOUNTER — Encounter: Payer: Self-pay | Admitting: Internal Medicine

## 2018-05-01 DIAGNOSIS — E785 Hyperlipidemia, unspecified: Secondary | ICD-10-CM

## 2018-05-01 DIAGNOSIS — I25118 Atherosclerotic heart disease of native coronary artery with other forms of angina pectoris: Secondary | ICD-10-CM | POA: Diagnosis not present

## 2018-05-01 DIAGNOSIS — Z8673 Personal history of transient ischemic attack (TIA), and cerebral infarction without residual deficits: Secondary | ICD-10-CM

## 2018-05-01 NOTE — Progress Notes (Signed)
Location:  [facility] Room Number: [phone] Place of Service:  SNF [phone])  [name], MD  Patient Care Team: [name], MD as PCP - General (Internal Medicine)  Extended Emergency Contact Information Primary Emergency Contact: Eldridge Dace of [place] Home Phone: [phone] Relation: Son Secondary Emergency Contact: Gregery Na [place] of [place] Home Phone: [phone] Relation: Relative    Allergies: Sulfa antibiotics  Chief Complaint  Patient presents with  . Medical Management of Chronic Issues    Routine Visit    HPI: Patient is 84 y.o. female who is being seen for routine issues of hyperlipidemia, history of stroke, and coronary artery disease.  Past Medical History:  Diagnosis Date  . Acute kidney injury (HCC)   . Acute lower UTI 10/13/2017  . Acute renal failure superimposed on stage 3 chronic kidney disease (HCC) 10/13/2017  . Acute right MCA stroke (HCC) 10/13/2017  . Angina at rest [facility])   . Aortic aneurysm (HCC)   . Coronary artery disease   . Dysphagia   . Fracture of left humerus 07/21/2012  . HTN (hypertension) 07/21/2012  . Hypertension   . Hypothyroid   . Hypothyroidism 07/21/2012  . Toxic metabolic encephalopathy 10/13/2017    Past Surgical History:  Procedure Laterality Date  . ABDOMINAL HYSTERECTOMY    . BLADDER SURGERY    . CAROTID ARTERY ANGIOPLASTY    . CATARACT EXTRACTION      Allergies as of 05/01/2018      Reactions   Sulfa Antibiotics    unknown      Medication List        Accurate as of 05/01/18 11:59 PM. Always use your most recent med list.          DIALYVITE VITAMIN D3 MAX 56213 units Tabs Generic drug:  Cholecalciferol Take 1 capsule by mouth every 30 (thirty) days.   diltiazem 240 MG 24 hr capsule Commonly known as:  CARDIZEM CD Take 1 capsule (240 mg total) by mouth daily.   ELIQUIS 2.5 MG Tabs tablet Generic drug:  apixaban Take  2.5 mg by mouth 2 (two) times daily.   levothyroxine 50 MCG tablet Commonly known as:  SYNTHROID, LEVOTHROID Take 50 mcg by mouth daily before breakfast.   multivitamin with minerals Tabs tablet Take 1 tablet by mouth daily.   nitroGLYCERIN 0.4 MG SL tablet Commonly known as:  NITROSTAT Place 0.4 mg under the tongue every 5 (five) minutes as needed. For chest pain   NUTRITIONAL SUPPLEMENT Liqd Take Magic Cup by mouth 3 times daily for weight support   UNABLE TO FIND 60 mLs 3 (three) times daily. Med Name: Medpass       No orders of the defined types were placed in this encounter.   Immunization History  Administered Date(s) Administered  . Pneumococcal Conjugate-13 07/16/2017  . Pneumococcal Polysaccharide-23 03/17/2018    Social History   Tobacco Use  . Smoking status: Former Games developer  . Smokeless tobacco: Never Used  Substance Use Topics  . Alcohol use: No    Review of Systems  DATA OBTAINED: from nurse GENERAL:  no fevers, fatigue, appetite changes SKIN: No itching, rash HEENT: No complaint RESPIRATORY: No cough, wheezing, SOB CARDIAC: No chest pain, palpitations, lower extremity edema  GI: No abdominal pain, No N/V/D or constipation, No heartburn or reflux  GU: No dysuria, frequency or urgency, or incontinence  MUSCULOSKELETAL: No unrelieved bone/joint  pain NEUROLOGIC: No headache, dizziness  PSYCHIATRIC: No overt anxiety or sadness  Vitals:   05/01/18 1023  BP: 122/74  Pulse: 92  Resp: 18  Temp: 97.8 F (36.6 C)  SpO2: 97%   Body mass index is 20.12 kg/m. Physical Exam  GENERAL APPEARANCE: Alert,  No acute distress  SKIN: No diaphoresis rash HEENT: Unremarkable RESPIRATORY: Breathing is even, unlabored. Lung sounds are clear   CARDIOVASCULAR: Heart RRR no murmurs, rubs or gallops. No peripheral edema  GASTROINTESTINAL: Abdomen is soft, non-tender, not distended w/ normal bowel sounds.  GENITOURINARY: Bladder non tender, not distended    MUSCULOSKELETAL: No abnormal joints or musculature NEUROLOGIC: Cranial nerves 2-12 grossly intact. Moves all extremities; limited communication PSYCHIATRIC:  no behavioral issues  Patient Active Problem List   Diagnosis Date Noted  . Atrial fibrillation (HCC) 01/08/2018  . Status post stroke 01/08/2018  . Coronary artery disease 01/08/2018  . Acute respiratory failure with hypoxia (HCC) 10/23/2017  . E. coli UTI 10/23/2017  . Dysphagia   . Counseling regarding advanced care planning and goals of care   . Hyperlipidemia   . Acute kidney injury (HCC)   . Atrial fibrillation with RVR (HCC)   . Palliative care by specialist   . Acute right MCA stroke (HCC) 10/13/2017  . Acute lower UTI 10/13/2017  . Lactic acidosis 10/13/2017  . Toxic metabolic encephalopathy 10/13/2017  . Acute renal failure superimposed on stage 3 chronic kidney disease (HCC) 10/13/2017  . Fracture of left humerus 07/21/2012  . HTN (hypertension) 07/21/2012  . Hypothyroidism 07/21/2012  . Aortic aneurysm (HCC) 07/21/2012    CMP     Component Value Date/Time   NA 141 05/04/2018   K 4.6 05/04/2018   CL 106 10/18/2017 0242   CO2 21 (L) 10/18/2017 0242   GLUCOSE 101 (H) 10/18/2017 0242   BUN 22 (A) 05/04/2018   CREATININE 0.80 05/04/2018   CALCIUM 8.7 05/04/2018   PROT 6.6 05/04/2018   ALBUMIN 3.4 05/04/2018   AST 17 05/04/2018   ALT 10 05/04/2018   ALKPHOS 75 05/04/2018   BILITOT 0.3 05/04/2018   GFRNONAA 60.85 05/04/2018   GFRAA 42 (L) 10/18/2017 0242   Recent Labs    10/16/17 0615 10/17/17 0223 10/18/17 0242 10/26/17 05/04/18  NA 147* 151* 146* 145 141  K 3.7 3.1* 3.7 4.1 4.6  CL 114* 113* 106  --   --   CO2 16* 23 21*  --   --   GLUCOSE 91 117* 101*  --   --   BUN 48* 45* 43* 20 22*  CREATININE 1.42* 1.32* 1.19* 0.6 0.80  CALCIUM 8.2* 8.9 8.7*  --  8.7   Recent Labs    10/13/17 1243 05/04/18  AST 64* 17  ALT 72* 10  ALKPHOS 111 75  BILITOT 1.3* 0.3  PROT 7.7 6.6  ALBUMIN 4.0 3.4    Recent Labs    10/13/17 1243  10/15/17 0303 10/16/17 0615 10/26/17 05/04/18  WBC 8.8   < > 11.1* 11.0* 9.0 6.6  10-20  NEUTROABS 7.3  --   --   --   --   --   HGB 12.0   < > 12.5 11.8* 15.0 12.3  HCT 36.8   < > 38.3 38.4 48* 38.6  MCV 97.4   < > 98.5 99.0  --  97.6  PLT 289   < > 187 172 326  --    < > = values in this interval not displayed.  Recent Labs    10/14/17 0226  CHOL 145  LDLCALC 88  TRIG 116   [facility]: [facility] Lab Results  Component Value Date   TSH 2.146 10/13/2017   Lab Results  Component Value Date   HGBA1C 5.8 (H) 10/14/2017   Lab Results  Component Value Date   CHOL 145 10/14/2017   HDL 34 (L) 10/14/2017   LDLCALC 88 10/14/2017   TRIG 116 10/14/2017   CHOLHDL 4.3 10/14/2017    Significant Diagnostic Results in last 30 days:  No results found.  Assessment and Plan  Hyperlipidemia LDL 88, HDL 34, on no meds secondary to age will monitor from time to time ;reasonable control for age as well; will monitor at intervals  Status post stroke Stable; patient on Eliquis twice a day as prophylaxis for atrial fib which covers this as well; patient's blood pressure is controlled; patient not on statin due to age  Coronary artery disease No reported chest pain or equivalent; continue Eliquis 2.5 mg twice daily, Lopressor 25 mg twice daily with sublingual nitroglycerin as needed; due to age patient is no longer on statin    Randon Goldsmithnne D. Lyn HollingsheadAlexander, MD

## 2018-05-04 LAB — URINALYSIS, COMPLETE
Bilirubin (Urine): NEGATIVE
Blood: NEGATIVE
Glucose: NEGATIVE
KETONES: NEGATIVE
Nitrite: POSITIVE
Specific Gravity: 1.015
UROBILINOGEN UA: NORMAL
pH: 7

## 2018-05-04 LAB — COMPLETE METABOLIC PANEL WITH GFR
ALK PHOS: 75
ALT: 10
AST: 17
Albumin: 3.4
BILIRUBIN TOTAL: 0.3
BUN: 22 — AB (ref 4–21)
CREATININE: 0.8
Calcium: 8.7
EGFR (Non-African Amer.): 60.85
GLUCOSE: 112
POTASSIUM: 4.6
SODIUM: 141
TOTAL PROTEIN: 6.6 g/dL

## 2018-05-04 LAB — CBC
HCT: 38.6
HEMOGLOBIN: 12.3
MCV: 97.6
WBC: 6.6
platelet count: 317

## 2018-05-05 ENCOUNTER — Non-Acute Institutional Stay: Payer: Self-pay | Admitting: Internal Medicine

## 2018-05-05 ENCOUNTER — Encounter: Payer: Self-pay | Admitting: *Deleted

## 2018-05-07 ENCOUNTER — Encounter: Payer: Self-pay | Admitting: Internal Medicine

## 2018-05-07 NOTE — Assessment & Plan Note (Signed)
No concerns; continue diltiazem 240 mg daily and prophylaxis with Eliquis 2.5 mg twice daily

## 2018-05-07 NOTE — Assessment & Plan Note (Signed)
Controlled; continue Cardizem 240 mg daily

## 2018-05-07 NOTE — Assessment & Plan Note (Signed)
Controlled; continue levothyroxine 50 mcg daily

## 2018-05-12 ENCOUNTER — Non-Acute Institutional Stay: Payer: Self-pay | Admitting: Internal Medicine

## 2018-05-26 ENCOUNTER — Encounter: Payer: Self-pay | Admitting: Internal Medicine

## 2018-05-26 NOTE — Assessment & Plan Note (Signed)
LDL 88, HDL 34, on no meds secondary to age will monitor from time to time ;reasonable control for age as well; will monitor at intervals

## 2018-05-26 NOTE — Assessment & Plan Note (Signed)
No reported chest pain or equivalent; continue Eliquis 2.5 mg twice daily, Lopressor 25 mg twice daily with sublingual nitroglycerin as needed; due to age patient is no longer on statin

## 2018-05-26 NOTE — Assessment & Plan Note (Signed)
Stable; patient on Eliquis twice a day as prophylaxis for atrial fib which covers this as well; patient's blood pressure is controlled; patient not on statin due to age

## 2018-05-29 ENCOUNTER — Encounter: Payer: Self-pay | Admitting: Internal Medicine

## 2018-05-29 ENCOUNTER — Non-Acute Institutional Stay (SKILLED_NURSING_FACILITY): Payer: Medicare Other | Admitting: Internal Medicine

## 2018-05-29 DIAGNOSIS — E034 Atrophy of thyroid (acquired): Secondary | ICD-10-CM | POA: Diagnosis not present

## 2018-05-29 DIAGNOSIS — I10 Essential (primary) hypertension: Secondary | ICD-10-CM

## 2018-05-29 DIAGNOSIS — I4891 Unspecified atrial fibrillation: Secondary | ICD-10-CM | POA: Diagnosis not present

## 2018-05-29 NOTE — Progress Notes (Signed)
Location:  [facility] Room Number: [phone] Place of Service:  SNF (31)  Jennifer Zavala. Lyn Hollingshead, MD  Patient Care Team: [name], MD as PCP - General (Internal Medicine)  Extended Emergency Contact Information Primary Emergency Contact: Eldridge Dace of [place] Home Phone: [phone] Relation: Son Secondary Emergency Contact: Gregery Na [place] of [place] Home Phone: [phone] Relation: Relative    Allergies: Sulfa antibiotics  Chief Complaint  Patient presents with  . Medical Management of Chronic Issues    Routine Visit    HPI: Patient is 84 y.o. female who is being seen for routine issues of hypertension, hypothyroidism, and atrial fibrillation.  Past Medical History:  Diagnosis Date  . Acute kidney injury (HCC)   . Acute lower UTI 10/13/2017  . Acute renal failure superimposed on stage 3 chronic kidney disease (HCC) 10/13/2017  . Acute right MCA stroke (HCC) 10/13/2017  . Ang[facility] at rest [facility])   . Aortic aneurysm (HCC)   . Coronary artery disease   . Dysphagia   . Fracture of left humerus 07/21/2012  . HTN (hypertension) 07/21/2012  . Hypertension   . Hypothyroid   . Hypothyroidism 07/21/2012  . Toxic metabolic encephalopathy 10/13/2017    Past Surgical History:  Procedure Laterality Date  . ABDOMINAL HYSTERECTOMY    . BLADDER SURGERY    . CAROTID ARTERY ANGIOPLASTY    . CATARACT EXTRACTION      Allergies as of 05/29/2018      Reactions   Sulfa Antibiotics    unknown      Medication List        Accurate as of 05/29/18 11:59 PM. Always use your most recent med list.          DIALYVITE VITAMIN D3 MAX 46962 units Tabs Generic drug:  Cholecalciferol Take 1 capsule by mouth every 30 (thirty) days.   ELIQUIS 2.5 MG Tabs tablet Generic drug:  apixaban Take 2.5 mg by mouth 2 (two) times daily.   levothyroxine 50 MCG tablet Commonly known as:  SYNTHROID, LEVOTHROID Take 50  mcg by mouth daily before breakfast.   multivitamin with minerals Tabs tablet Take 1 tablet by mouth daily.   nitroGLYCERIN 0.4 MG SL tablet Commonly known as:  NITROSTAT Place 0.4 mg under the tongue every 5 (five) minutes as needed. For chest pain   NUTRITIONAL SUPPLEMENT Liqd Take Magic Cup by mouth 3 times daily for weight support   UNABLE TO FIND 60 mLs 3 (three) times daily. Med Name: Medpass       No orders of the defined types were placed in this encounter.   Immunization History  Administered Date(s) Administered  . Pneumococcal Conjugate-13 07/16/2017  . Pneumococcal Polysaccharide-23 03/17/2018    Social History   Tobacco Use  . Smoking status: Former Games developer  . Smokeless tobacco: Never Used  Substance Use Topics  . Alcohol use: No    Review of Systems  DATA OBTAINED: from nurse GENERAL:  no fevers, fatigue, appetite changes SKIN: No itching, rash HEENT: No complaint RESPIRATORY: No cough, wheezing, SOB CARDIAC: No chest pain, palpitations, lower extremity edema  GI: No abdominal pain, No N/V/D or constipation, No heartburn or reflux  GU: No dysuria, frequency or urgency, or incontinence  MUSCULOSKELETAL: No unrelieved bone/joint pain NEUROLOGIC: No headache, dizziness  PSYCHIATRIC: No overt anxiety or sadness  Vitals:   05/29/18 1517  BP: (!) 102/54  Pulse: 84  Resp: 18  Temp: (!) 97.3 F (36.3 C)   Body mass index is 19.31 kg/m. Physical Exam  GENERAL APPEARANCE: Alert,  No acute distress  SKIN: No diaphoresis rash HEENT: Unremarkable RESPIRATORY: Breathing is even, unlabored. Lung sounds are clear   CARDIOVASCULAR: Heart RRR no murmurs, rubs or gallops. No peripheral edema  GASTROINTESTINAL: Abdomen is soft, non-tender, not distended w/ normal bowel sounds.  GENITOURINARY: Bladder non tender, not distended  MUSCULOSKELETAL: No abnormal joints or musculature NEUROLOGIC: Cranial nerves 2-12 grossly intact. Moves all  extremities PSYCHIATRIC: Limited communication, no behavioral issues  Patient Active Problem List   Diagnosis Date Noted  . Palliative care encounter 06/12/2018  . Weakness 06/11/2018  . Loss of appetite 06/11/2018  . Atrial fibrillation (HCC) 01/08/2018  . History of right MCA stroke 01/08/2018  . Coronary artery disease 01/08/2018  . Acute respiratory failure with hypoxia (HCC) 10/23/2017  . E. coli UTI 10/23/2017  . Dysphagia   . Counseling regarding advanced care planning and goals of care   . Hyperlipidemia   . Acute kidney injury (HCC)   . Atrial fibrillation with RVR (HCC)   . Palliative care by specialist   . Acute right MCA stroke (HCC) 10/13/2017  . Acute lower UTI 10/13/2017  . Lactic acidosis 10/13/2017  . Toxic metabolic encephalopathy 10/13/2017  . Acute renal failure superimposed on stage 3 chronic kidney disease (HCC) 10/13/2017  . Fracture of left humerus 07/21/2012  . HTN (hypertension) 07/21/2012  . Hypothyroidism 07/21/2012  . Aortic aneurysm (HCC) 07/21/2012    CMP     Component Value Date/Time   NA 141 05/04/2018   K 4.6 05/04/2018   CL 106 10/18/2017 0242   CO2 21 (L) 10/18/2017 0242   GLUCOSE 101 (H) 10/18/2017 0242   BUN 22 (A) 05/04/2018   CREATININE 0.80 05/04/2018   CALCIUM 8.7 05/04/2018   PROT 6.6 05/04/2018   ALBUMIN 3.4 05/04/2018   AST 17 05/04/2018   ALT 10 05/04/2018   ALKPHOS 75 05/04/2018   BILITOT 0.3 05/04/2018   GFRNONAA 60.85 05/04/2018   GFRAA 42 (L) 10/18/2017 0242   Recent Labs    10/16/17 0615 10/17/17 0223 10/18/17 0242 10/26/17 05/04/18  NA 147* 151* 146* 145 141  K 3.7 3.1* 3.7 4.1 4.6  CL 114* 113* 106  --   --   CO2 16* 23 21*  --   --   GLUCOSE 91 117* 101*  --   --   BUN 48* 45* 43* 20 22*  CREATININE 1.42* 1.32* 1.19* 0.6 0.80  CALCIUM 8.2* 8.9 8.7*  --  8.7   Recent Labs    10/13/17 1243 05/04/18  AST 64* 17  ALT 72* 10  ALKPHOS 111 75  BILITOT 1.3* 0.3  PROT 7.7 6.6  ALBUMIN 4.0 3.4    Recent Labs    10/13/17 1243  10/15/17 0303 10/16/17 0615 10/26/17 05/04/18  WBC 8.8   < > 11.1* 11.0* 9.0 6.6  10-20  NEUTROABS 7.3  --   --   --   --   --   HGB 12.0   < > 12.5 11.8* 15.0 12.3  HCT 36.8   < > 38.3 38.4 48* 38.6  MCV 97.4   < > 98.5 99.0  --  97.6  PLT 289   < > 187 172 326  --    < > = values in this interval not displayed.   Recent Labs    10/14/17 0226  CHOL 145  LDLCALC 88  TRIG 116   [facility] results found for: [facility] Lab Results  Component Value Date   TSH 2.146 10/13/2017   Lab Results  Component Value Date   HGBA1C 5.8 (H) 10/14/2017   Lab Results  Component Value Date   CHOL 145 10/14/2017   HDL 34 (L) 10/14/2017   LDLCALC 88 10/14/2017   TRIG 116 10/14/2017   CHOLHDL 4.3 10/14/2017    Significant Diagnostic Results in last 30 days:  No results found.  Assessment and Plan  HTN (hypertension) Controlled on no medications; monitor intervals  Hypothyroidism TSH 2.146, controlled; continue Synthroid 50 mcg daily  Atrial fibrillation (HCC) Rate controlled on no medications; prophylaxis with Eliquis 2.5 mg twice daily    Shivaan Tierno D. Lyn HollingsheadAlexander, MD

## 2018-05-30 ENCOUNTER — Non-Acute Institutional Stay: Payer: Medicare Other | Admitting: Internal Medicine

## 2018-05-30 DIAGNOSIS — R630 Anorexia: Secondary | ICD-10-CM

## 2018-05-30 DIAGNOSIS — R531 Weakness: Secondary | ICD-10-CM

## 2018-06-04 NOTE — Progress Notes (Signed)
PALLIATIVE CARE CONSULT VISIT   PATIENT NAME: Jennifer Zavala DOB: 11/09/1916 MRN: [id number]  PRIMARY CARE PROVIDER:   Margit Zavala, Jennifer D, MD  REFERRING PROVIDER:  Margit Zavala, Jennifer D, MD [address] [place] [address]  RESPONSIBLE PARTY:   Jennifer Zavala(son)  [phone]    RECOMMENDATIONS and PLAN:   1.  Weakness R 53.1:  Chronic and variable state.  Nutritional supplements daily as tolerated.  Continue to provide supportive care.   2.  Loss of appetite R63.0:  Nutritional supplements and appetite stimulant. Comfort foods prn.  Monitor for improvement or decline.  Weekly weights.  3.  Advanced care planning  :  DNR/DNI as per previous discussion with son who is interested in comfort measures if patient has a recurrent decline of health.  Palliative care will continue to follow.    I spent 15 minutes providing this consultation,  from 11:50am to 12:05pm:. More than 50% of the time in this consultation was spent coordinating communication.   HISTORY OF PRESENT ILLNESS: Followup with Jennifer Zavala . Staff reports that patient is more interactive and nutritional intake is sporatic since last visit.  Hydration remains a challenge.  No reports of falls or hospitalization since last visit.     CODE STATUS: DNR/DNI  PPS: 30% HOSPICE ELIGIBILITY/DIAGNOSIS: TBD  PAST MEDICAL HISTORY:  Past Medical History:  Diagnosis Date  . Acute kidney injury (HCC)   . Acute lower UTI 10/13/2017  . Acute renal failure superimposed on stage 3 chronic kidney disease (HCC) 10/13/2017  . Acute right MCA stroke (HCC) 10/13/2017  . Angina at rest [facility](HCC)   . Aortic aneurysm (HCC)   . Coronary artery disease   . Dysphagia   . Fracture of left humerus 07/21/2012  . HTN (hypertension) 07/21/2012  . Hypertension   . Hypothyroid   . Hypothyroidism 07/21/2012  . Toxic metabolic encephalopathy 10/13/2017    SOCIAL HX:  Social History   Tobacco Use  . Smoking status: Former Games developermoker  .  Smokeless tobacco: Never Used  Substance Use Topics  . Alcohol use: No    ALLERGIES:  Allergies  Allergen Reactions  . Sulfa Antibiotics     unknown     PERTINENT MEDICATIONS:  Outpatient Encounter Medications as of 05/30/2018  Medication Sig  . apixaban (ELIQUIS) 2.5 MG TABS tablet Take 2.5 mg by mouth 2 (two) times daily.   . Cholecalciferol (DIALYVITE VITAMIN D3 MAX) 3295150000 units TABS Take 1 capsule by mouth every 30 (thirty) days.  Marland Kitchen. levothyroxine (SYNTHROID, LEVOTHROID) 50 MCG tablet Take 50 mcg by mouth daily before breakfast.  . Multiple Vitamin (MULTIVITAMIN WITH MINERALS) TABS Take 1 tablet by mouth daily.  . nitroGLYCERIN (NITROSTAT) 0.4 MG SL tablet Place 0.4 mg under the tongue every 5 (five) minutes as needed. For chest pain  . NUTRITIONAL SUPPLEMENT LIQD Take Magic Cup by mouth 3 times daily for weight support  . UNABLE TO FIND 60 mLs 3 (three) times daily. Med Name: Medpass   No facility-administered encounter medications on file as of 05/30/2018.     PHYSICAL EXAM:   Sats 94%  P 95  General: NAD, frail and fragile appearing, thin elderly female reclining in bed.  In NAD Cardiovascular: regular rate and rhythm Pulmonary: clear ant fields Abdomen: soft, nontender, + bowel sounds GU: no suprapubic tenderness Extremities: no edema, muscular atrophy Skin: pink in color, exposed skin is intact Neurological: Alert and oriented x 2.  Generalized weakness.  Follows commands.  Speaks  in sentences.  Jennifer Sheffield, NP-C

## 2018-06-11 DIAGNOSIS — R630 Anorexia: Secondary | ICD-10-CM | POA: Insufficient documentation

## 2018-06-11 DIAGNOSIS — R531 Weakness: Secondary | ICD-10-CM | POA: Insufficient documentation

## 2018-06-12 DIAGNOSIS — Z7189 Other specified counseling: Secondary | ICD-10-CM | POA: Insufficient documentation

## 2018-06-12 NOTE — Progress Notes (Signed)
PALLIATIVE CARE CONSULT VISIT   PATIENT NAME: Jennifer Zavala DOB: March 02, 1916 MRN: [id number]  PRIMARY CARE PROVIDER:   [name], MD  REFERRING PROVIDER:  [name], MD [address] [place], [place] [address]  RESPONSIBLE PARTY:   Felicidad Sugarman) [phone]       RECOMMENDATIONS and PLAN:  1.  Weakness R53.1: Chronic state with  Noted increase.and hypotension.  Attempt oral rehydration and repeat IV saline challenge per re quest of son  2.  Loss of appetite  R63.0:: Continue to offer meals and Ensure TID.  Monitor weight weekly if possible.  May be new baseline. Monitor  3.  Palliative care encounter:  Phone discussion with son Lorin Picket to review goals of care and advanced care planning. His goal is to avoid hospitalization for patient but to provide services that may be available at the snf. MOST form was reviewed and he chose DNAR/DNI, IV and antibiotics if indicated and no feeding tube.  Palliative care will continue to follow patient and update son as needed.  I spent 60 minuttes providing this consultation,  from 11:30am to 12:30pm at [facility]. More than 50% of the time in this consultation was spent coordinating communication with clinical staff.   HISTORY OF PRESENT ILLNESS:  Jennifer Zavala is a 84 y.o. year old female with multiple medical problems including  afib, stroke and CAD, aortic aneurysm.  Clinical staff reports increased weakness and a decrease of nutritional intake from 50% to 25% within the past 2 weeks.  She received IV hydration today due to hypotension.  She normally is nonambulatory and totallyPalliative Care was asked to help address goals of care.   CODE STATUS: DNR  PPS: 20% HOSPICE ELIGIBILITY/DIAGNOSIS: TBD  PAST MEDICAL HISTORY:  Past Medical History:  Diagnosis Date  . Acute kidney injury (HCC)   . Acute lower UTI 10/13/2017  . Acute renal failure superimposed on stage 3 chronic kidney disease (HCC) 10/13/2017  .  Acute right MCA stroke (HCC) 10/13/2017  . Angina at rest [facility])   . Aortic aneurysm (HCC)   . Coronary artery disease   . Dysphagia   . Fracture of left humerus 07/21/2012  . HTN (hypertension) 07/21/2012  . Hypertension   . Hypothyroid   . Hypothyroidism 07/21/2012  . Toxic metabolic encephalopathy 10/13/2017    SOCIAL HX:  Social History   Tobacco Use  . Smoking status: Former Games developer  . Smokeless tobacco: Never Used  Substance Use Topics  . Alcohol use: No    ALLERGIES:  Allergies  Allergen Reactions  . Sulfa Antibiotics     unknown     PERTINENT MEDICATIONS:  Outpatient Encounter Medications as of 05/05/2018  Medication Sig  . apixaban (ELIQUIS) 2.5 MG TABS tablet Take 2.5 mg by mouth 2 (two) times daily.   . Cholecalciferol (DIALYVITE VITAMIN D3 MAX) 64332 units TABS Take 1 capsule by mouth every 30 (thirty) days.  Marland Kitchen levothyroxine (SYNTHROID, LEVOTHROID) 50 MCG tablet Take 50 mcg by mouth daily before breakfast.  . Multiple Vitamin (MULTIVITAMIN WITH MINERALS) TABS Take 1 tablet by mouth daily.  . nitroGLYCERIN (NITROSTAT) 0.4 MG SL tablet Place 0.4 mg under the tongue every 5 (five) minutes as needed. For chest pain  . NUTRITIONAL SUPPLEMENT LIQD Take Magic Cup by mouth 3 times daily for weight support  . UNABLE TO FIND 60 mLs 3 (three) times daily. Med Name: Medpass  . [DISCONTINUED] diltiazem (CARDIZEM CD) 240 MG 24 hr capsule Take 1  capsule (240 mg total) by mouth daily.   No facility-administered encounter medications on file as of 05/05/2018.     PHYSICAL EXAM:   General: NAD, frail and fragile appearing elderly female reclining in bed Cardiovascular: regular rate and rhythm Pulmonary: clear ant fields Abdomen: soft, nontender, + bowel sounds GU: no suprapubic tenderness Extremities: no edema Skin: Exposed skin is intact Neurological: Alert and oriented x 2. Generalized weakness Psych:  Pleasant mood.  Calm and cooperative.  Margaretha SheffieldShirley Zygmund Passero, NP-C

## 2018-06-23 ENCOUNTER — Encounter: Payer: Self-pay | Admitting: Internal Medicine

## 2018-06-23 NOTE — Assessment & Plan Note (Signed)
Controlled on no medications; monitor intervals

## 2018-06-23 NOTE — Assessment & Plan Note (Signed)
TSH 2.146, controlled; continue Synthroid 50 mcg daily

## 2018-06-23 NOTE — Assessment & Plan Note (Signed)
Rate controlled on no medications; prophylaxis with Eliquis 2.5 mg twice daily

## 2018-07-04 ENCOUNTER — Non-Acute Institutional Stay (SKILLED_NURSING_FACILITY): Payer: Medicare Other | Admitting: Internal Medicine

## 2018-07-04 ENCOUNTER — Encounter: Payer: Self-pay | Admitting: Internal Medicine

## 2018-07-04 DIAGNOSIS — E034 Atrophy of thyroid (acquired): Secondary | ICD-10-CM

## 2018-07-04 DIAGNOSIS — I10 Essential (primary) hypertension: Secondary | ICD-10-CM

## 2018-07-04 DIAGNOSIS — I25118 Atherosclerotic heart disease of native coronary artery with other forms of angina pectoris: Secondary | ICD-10-CM | POA: Diagnosis not present

## 2018-07-04 NOTE — Progress Notes (Signed)
[facility] Room Number: [phone] Place of Service:  SNF (31)  Randon Goldsmithnne D. Lyn HollingsheadAlexander, MD  Patient Care Team: Margit HanksAlexander, Marchello Rothgeb D, MD as PCP - General (Internal Medicine)  Extended Emergency Contact Information Primary Emergency Contact: Merlino,Scott Mobile Phone: [phone] Relation: Son Secondary Emergency Contact: Mora BellmanGibson,Ed          [place] Home Phone: [phone] Relation: Son    Allergies: Sulfa antibiotics  Chief Complaint  Patient presents with  . Medical Management of Chronic Issues    Routine Visit    HPI: Patient is 84 y.o. female who is being seen for routine issues of hypertension, hypothyroidism, and coronary artery disease.  Past Medical History:  Diagnosis Date  . Acute kidney injury (HCC)   . Acute lower UTI 10/13/2017  . Acute renal failure superimposed on stage 3 chronic kidney disease (HCC) 10/13/2017  . Acute right MCA stroke (HCC) 10/13/2017  . Angina at rest [facility](HCC)   . Aortic aneurysm (HCC)   . Coronary artery disease   . Dysphagia   . Fracture of left humerus 07/21/2012  . HTN (hypertension) 07/21/2012  . Hypertension   . Hypothyroid   . Hypothyroidism 07/21/2012  . Toxic metabolic encephalopathy 10/13/2017    Past Surgical History:  Procedure Laterality Date  . ABDOMINAL HYSTERECTOMY    . BLADDER SURGERY    . CAROTID ARTERY ANGIOPLASTY    . CATARACT EXTRACTION      Allergies as of 07/04/2018      Reactions   Sulfa Antibiotics    unknown      Medication List        Accurate as of 07/04/18 11:59 PM. Always use your most recent med list.          DIALYVITE VITAMIN D3 MAX 2130850000 units Tabs Generic drug:  Cholecalciferol Take 1 capsule by mouth every 30 (thirty) days.   levothyroxine 50 MCG tablet Commonly known as:  SYNTHROID, LEVOTHROID Take 50 mcg by mouth daily before breakfast.   multivitamin with minerals Tabs tablet Take 1 tablet by mouth daily.     nitroGLYCERIN 0.4 MG SL tablet Commonly known as:  NITROSTAT Place 0.4 mg under the tongue every 5 (five) minutes as needed. For chest pain   NUTRITIONAL SUPPLEMENT Liqd Take Magic Cup by mouth 3 times daily for weight support   ondansetron 4 MG tablet Commonly known as:  ZOFRAN Take 4 mg by mouth every 6 (six) hours as needed for nausea or vomiting.   UNABLE TO FIND 60 mLs 3 (three) times daily. Med Name: Medpass       No orders of the defined types were placed in this encounter.   Immunization History  Administered Date(s) Administered  . Pneumococcal Conjugate-13 07/16/2017  . Pneumococcal Polysaccharide-23 03/17/2018    Social History   Tobacco Use  . Smoking status: Former Games developermoker  . Smokeless tobacco: Never Used  Substance Use Topics  . Alcohol use: No    Review of Systems  DATA OBTAINED: from patient-limited; nursing-no acute concerns GENERAL:  no fevers, fatigue, appetite changes SKIN: No itching, rash HEENT: No complaint RESPIRATORY: No cough, wheezing, SOB CARDIAC: No chest pain, palpitations, lower extremity edema  GI: No abdominal pain, No N/V/D or constipation, No heartburn or reflux  GU: No dysuria, frequency or urgency, or incontinence  MUSCULOSKELETAL: No unrelieved bone/joint pain NEUROLOGIC: No headache, dizziness  PSYCHIATRIC: No overt anxiety or sadness  Vitals:   07/04/18 1436  BP: (!) 100/52  Pulse: 96  Resp: 16  Temp: (!) 97 F (36.1 C)   Body mass index is 18.85 kg/m. Physical Exam  GENERAL APPEARANCE: Alert,  No acute distress  SKIN: No diaphoresis rash HEENT: Unremarkable RESPIRATORY: Breathing is even, unlabored. Lung sounds are clear   CARDIOVASCULAR: Heart RRR no murmurs, rubs or gallops. No peripheral edema  GASTROINTESTINAL: Abdomen is soft, non-tender, not distended w/ normal bowel sounds.  GENITOURINARY: Bladder non tender, not distended  MUSCULOSKELETAL: No abnormal joints or musculature NEUROLOGIC: Cranial nerves  2-12 grossly intact. Moves all extremities PSYCHIATRIC: Mood and affect-limited communication, no behavioral issues  Patient Active Problem List   Diagnosis Date Noted  . Palliative care encounter 06/12/2018  . Weakness 06/11/2018  . Loss of appetite 06/11/2018  . Atrial fibrillation (HCC) 01/08/2018  . History of right MCA stroke 01/08/2018  . Coronary artery disease 01/08/2018  . Acute respiratory failure with hypoxia (HCC) 10/23/2017  . E. coli UTI 10/23/2017  . Dysphagia   . Counseling regarding advanced care planning and goals of care   . Hyperlipidemia   . Acute kidney injury (HCC)   . Atrial fibrillation with RVR (HCC)   . Palliative care by specialist   . Acute right MCA stroke (HCC) 10/13/2017  . Acute lower UTI 10/13/2017  . Lactic acidosis 10/13/2017  . Toxic metabolic encephalopathy 10/13/2017  . Acute renal failure superimposed on stage 3 chronic kidney disease (HCC) 10/13/2017  . Fracture of left humerus 07/21/2012  . HTN (hypertension) 07/21/2012  . Hypothyroidism 07/21/2012  . Aortic aneurysm (HCC) 07/21/2012    CMP     Component Value Date/Time   NA 141 05/04/2018   K 4.6 05/04/2018   CL 106 10/18/2017 0242   CO2 21 (L) 10/18/2017 0242   GLUCOSE 101 (H) 10/18/2017 0242   BUN 22 (A) 05/04/2018   CREATININE 0.80 05/04/2018   CALCIUM 8.7 05/04/2018   PROT 6.6 05/04/2018   ALBUMIN 3.4 05/04/2018   AST 17 05/04/2018   ALT 10 05/04/2018   ALKPHOS 75 05/04/2018   BILITOT 0.3 05/04/2018   GFRNONAA 60.85 05/04/2018   GFRAA 42 (L) 10/18/2017 0242   Recent Labs    10/16/17 0615 10/17/17 0223 10/18/17 0242 10/26/17 05/04/18  NA 147* 151* 146* 145 141  K 3.7 3.1* 3.7 4.1 4.6  CL 114* 113* 106  --   --   CO2 16* 23 21*  --   --   GLUCOSE 91 117* 101*  --   --   BUN 48* 45* 43* 20 22*  CREATININE 1.42* 1.32* 1.19* 0.6 0.80  CALCIUM 8.2* 8.9 8.7*  --  8.7   Recent Labs    10/13/17 1243 05/04/18  AST 64* 17  ALT 72* 10  ALKPHOS 111 75  BILITOT  1.3* 0.3  PROT 7.7 6.6  ALBUMIN 4.0 3.4   Recent Labs    10/13/17 1243  10/15/17 0303 10/16/17 0615 10/26/17 05/04/18  WBC 8.8   < > 11.1* 11.0* 9.0 6.6  10-20  NEUTROABS 7.3  --   --   --   --   --   HGB 12.0   < > 12.5 11.8* 15.0 12.3  HCT 36.8   < > 38.3 38.4 48* 38.6  MCV 97.4   < > 98.5 99.0  --  97.6  PLT 289   < > 187 172 326  --    < > = values in this interval not displayed.   Recent Labs  10/14/17 0226  CHOL 145  LDLCALC 88  TRIG 116   [facility] results found for: [facility] Lab Results  Component Value Date   TSH 2.146 10/13/2017   Lab Results  Component Value Date   HGBA1C 5.8 (H) 10/14/2017   Lab Results  Component Value Date   CHOL 145 10/14/2017   HDL 34 (L) 10/14/2017   LDLCALC 88 10/14/2017   TRIG 116 10/14/2017   CHOLHDL 4.3 10/14/2017    Significant Diagnostic Results in last 30 days:  No results found.  Assessment and Plan  HTN (hypertension) Controlled on no meds; monitor blood pressure monthly  Hypothyroidism Controlled; continue Synthroid 50 mcg daily  Coronary artery disease Has been taken off most meds; patient has sublingual nitroglycerin as needed    Thurston Holenne D. Lyn HollingsheadAlexander, MD

## 2018-07-08 ENCOUNTER — Encounter: Payer: Self-pay | Admitting: Internal Medicine

## 2018-07-08 NOTE — Assessment & Plan Note (Signed)
Has been taken off most meds; patient has sublingual nitroglycerin as needed

## 2018-07-08 NOTE — Assessment & Plan Note (Signed)
Controlled on no meds; monitor blood pressure monthly

## 2018-07-08 NOTE — Assessment & Plan Note (Signed)
Controlled; continue Synthroid 50 mcg daily

## 2018-07-28 ENCOUNTER — Non-Acute Institutional Stay: Payer: Medicare Other | Admitting: Internal Medicine

## 2018-07-28 VITALS — BP 104/80 | HR 88 | Resp 20 | Wt 104.0 lb

## 2018-07-28 DIAGNOSIS — R630 Anorexia: Secondary | ICD-10-CM

## 2018-07-28 DIAGNOSIS — Z7189 Other specified counseling: Secondary | ICD-10-CM

## 2018-07-28 DIAGNOSIS — R531 Weakness: Secondary | ICD-10-CM

## 2018-07-28 NOTE — Progress Notes (Signed)
PALLIATIVE CARE CONSULT VISIT   PATIENT NAME: Jennifer Zavala DOB: [birth year] MRN: [id number]  PRIMARY CARE PROVIDER:   Margit HanksAlexander, Anne D, MD  REFERRING PROVIDER:  Margit HanksAlexander, Anne D, MD [address] [place] [address]  RESPONSIBLE PARTY:   Tyler AasChris Scott(son)  [phone]    RECOMMENDATIONS and PLAN:   1.  Weakness R 53.1:  Chronic without improvement.  Nutritional supplements daily as tolerated.  Supportive care.   2.  Loss of appetite R63.0:  Continue comfort feedings and supplements. Feed patient if allowed  Weekly weights.  3.  Advanced care planning Z71.89 :  DNR/DNI.  Son understands that patient may be nearing end of life. He will discuss updates with family members.  Comfort measures at snf.   Appropriate for transition to Hospice care if desired.    [facility],  from 11:00am to 11[facility]15am [facility] More than 50% of the time in this consultation was spent coordinating communication with patient and clinical staff and son.  HISTORY OF PRESENT ILLNESS: Followup with Jennifer Zavala . She continues to have poor nutritional intake and is sleeping more during the day.  No reports of falls or acute illnesses. Family is very supportive.  CODE STATUS: DNR/DNI  PPS: 30% ,In wc on occasionally HOSPICE ELIGIBILITY/DIAGNOSIS: YES/Protein calorie malnutrition  PAST MEDICAL HISTORY:  Past Medical History:  Diagnosis Date  . Acute kidney injury (HCC)   . Acute lower UTI 10/13/2017  . Acute renal failure superimposed on stage 3 chronic kidney disease (HCC) 10/13/2017  . Acute right MCA stroke (HCC) 10/13/2017  . Angina at rest [facility](HCC)   . Aortic aneurysm (HCC)   . Coronary artery disease   . Dysphagia   . Fracture of left humerus 07/21/2012  . HTN (hypertension) 07/21/2012  . Hypertension   . Hypothyroid   . Hypothyroidism 07/21/2012  . Toxic metabolic encephalopathy 10/13/2017    SOCIAL HX:  Social History    Tobacco Use  . Smoking status: Former Games developermoker  . Smokeless tobacco: Never Used  Substance Use Topics  . Alcohol use: No    ALLERGIES:  Allergies  Allergen Reactions  . Sulfa Antibiotics     unknown     PERTINENT MEDICATIONS:  Outpatient Encounter Medications as of 05/30/2018  Medication Sig  . apixaban (ELIQUIS) 2.5 MG TABS tablet Take 2.5 mg by mouth 2 (two) times daily.   . Cholecalciferol (DIALYVITE VITAMIN D3 MAX) 9629550000 units TABS Take 1 capsule by mouth every 30 (thirty) days.  Marland Kitchen. levothyroxine (SYNTHROID, LEVOTHROID) 50 MCG tablet Take 50 mcg by mouth daily before breakfast.  . Multiple Vitamin (MULTIVITAMIN WITH MINERALS) TABS Take 1 tablet by mouth daily.  . nitroGLYCERIN (NITROSTAT) 0.4 MG SL tablet Place 0.4 mg under the tongue every 5 (five) minutes as needed. For chest pain  . NUTRITIONAL SUPPLEMENT LIQD Take Magic Cup by mouth 3 times daily for weight support  . UNABLE TO FIND 60 mLs 3 (three) times daily. Med Name: Medpass   No facility-administered encounter medications on file as of 05/30/2018.     PHYSICAL EXAM:  General: Frail appearing, thin elderly female reclining in bed.  In NAD ENT:  Dry mucus membranes Cardiovascular: regular rate and rhythm Pulmonary: clear ant fields Abdomen: soft, nontender, + bowel sounds Extremities: no edema, muscular atrophy Skin: pink in color, exposed skin is intact and cool to touch Neurological: Somnolent but arouses easily Generalized weakness.  Follows commands.  Psych:  Cooperative  Margaretha Sheffield, NP-C

## 2018-07-31 ENCOUNTER — Encounter: Payer: Self-pay | Admitting: Internal Medicine

## 2018-07-31 ENCOUNTER — Non-Acute Institutional Stay (SKILLED_NURSING_FACILITY): Payer: Medicare Other | Admitting: Internal Medicine

## 2018-07-31 DIAGNOSIS — I4891 Unspecified atrial fibrillation: Secondary | ICD-10-CM

## 2018-07-31 DIAGNOSIS — I25118 Atherosclerotic heart disease of native coronary artery with other forms of angina pectoris: Secondary | ICD-10-CM | POA: Diagnosis not present

## 2018-07-31 DIAGNOSIS — Z8673 Personal history of transient ischemic attack (TIA), and cerebral infarction without residual deficits: Secondary | ICD-10-CM

## 2018-07-31 NOTE — Progress Notes (Signed)
[facility] Room Number: [phone] Place of Service:  SNF (31)  Randon Goldsmithnne D. Lyn HollingsheadAlexander, MD  Patient Care Team: Margit HanksAlexander, Brallan Denio D, MD as PCP - General (Internal Medicine)  Extended Emergency Contact Information Primary Emergency Contact: Sick,Scott Mobile Phone: [phone] Relation: Son Secondary Emergency Contact: Mora BellmanGibson,Ed          [place] Home Phone: [phone] Relation: Son    Allergies: Sulfa antibiotics  Chief Complaint  Patient presents with  . Medical Management of Chronic Issues    Routine Visit    HPI: Patient is 84 y.o. female who is being seen for routine issues of coronary artery disease, history of MCA stroke, and atrial fib.  Past Medical History:  Diagnosis Date  . Acute kidney injury (HCC)   . Acute lower UTI 10/13/2017  . Acute renal failure superimposed on stage 3 chronic kidney disease (HCC) 10/13/2017  . Acute right MCA stroke (HCC) 10/13/2017  . Angina at rest [facility](HCC)   . Aortic aneurysm (HCC)   . Coronary artery disease   . Dysphagia   . Fracture of left humerus 07/21/2012  . HTN (hypertension) 07/21/2012  . Hypertension   . Hypothyroid   . Hypothyroidism 07/21/2012  . Toxic metabolic encephalopathy 10/13/2017    Past Surgical History:  Procedure Laterality Date  . ABDOMINAL HYSTERECTOMY    . BLADDER SURGERY    . CAROTID ARTERY ANGIOPLASTY    . CATARACT EXTRACTION      Allergies as of 07/31/2018      Reactions   Sulfa Antibiotics    unknown      Medication List        Accurate as of 07/31/18 11:59 PM. Always use your most recent med list.          DIALYVITE VITAMIN D3 MAX 2130850000 units Tabs Generic drug:  Cholecalciferol Take 1 capsule by mouth every 30 (thirty) days.   levothyroxine 50 MCG tablet Commonly known as:  SYNTHROID, LEVOTHROID Take 50 mcg by mouth daily before breakfast.   multivitamin with minerals Tabs tablet Take 1 tablet by mouth daily.     nitroGLYCERIN 0.4 MG SL tablet Commonly known as:  NITROSTAT Place 0.4 mg under the tongue every 5 (five) minutes as needed. For chest pain   NUTRITIONAL SUPPLEMENT Liqd Take Magic Cup by mouth 3 times daily for weight support   ondansetron 4 MG tablet Commonly known as:  ZOFRAN Take 4 mg by mouth every 6 (six) hours as needed for nausea or vomiting.   UNABLE TO FIND 60 mLs 3 (three) times daily. Med Name: Medpass       No orders of the defined types were placed in this encounter.   Immunization History  Administered Date(s) Administered  . Pneumococcal Conjugate-13 07/16/2017  . Pneumococcal Polysaccharide-23 03/17/2018    Social History   Tobacco Use  . Smoking status: Former Games developermoker  . Smokeless tobacco: Never Used  Substance Use Topics  . Alcohol use: No    Review of Systems  DATA OBTAINED: from nurse GENERAL:  no fevers, fatigue, appetite changes SKIN: No itching, rash HEENT: No complaint RESPIRATORY: No cough, wheezing, SOB CARDIAC: No chest pain, palpitations, lower extremity edema  GI: No abdominal pain, No N/V/D or constipation, No heartburn or reflux  GU: No dysuria, frequency or urgency, or incontinence  MUSCULOSKELETAL: No unrelieved bone/joint pain NEUROLOGIC: No headache, dizziness  PSYCHIATRIC: No overt anxiety or sadness  Vitals:   07/31/18 1430  BP: 102/75  Pulse: (!) 57  Resp: 18  Temp: (!) 96.2 F (35.7 C)   Body mass index is 18.42 kg/m. Physical Exam  GENERAL APPEARANCE: Alert,  No acute distress  SKIN: No diaphoresis rash HEENT: Unremarkable RESPIRATORY: Breathing is even, unlabored. Lung sounds are clear   CARDIOVASCULAR: Heart RRR no murmurs, rubs or gallops. No peripheral edema  GASTROINTESTINAL: Abdomen is soft, non-tender, not distended w/ normal bowel sounds.  GENITOURINARY: Bladder non tender, not distended  MUSCULOSKELETAL: No abnormal joints or musculature NEUROLOGIC: Cranial nerves 2-12 grossly intact. Moves all  extremities PSYCHIATRIC: Mood and affect dementia communication, no behavioral issues  Patient Active Problem List   Diagnosis Date Noted  . Palliative care encounter 06/12/2018  . Weakness 06/11/2018  . Loss of appetite 06/11/2018  . Atrial fibrillation (HCC) 01/08/2018  . History of right MCA stroke 01/08/2018  . Coronary artery disease 01/08/2018  . Acute respiratory failure with hypoxia (HCC) 10/23/2017  . E. coli UTI 10/23/2017  . Dysphagia   . Counseling regarding advanced care planning and goals of care   . Hyperlipidemia   . Acute kidney injury (HCC)   . Atrial fibrillation with RVR (HCC)   . Palliative care by specialist   . Acute right MCA stroke (HCC) 10/13/2017  . Acute lower UTI 10/13/2017  . Lactic acidosis 10/13/2017  . Toxic metabolic encephalopathy 10/13/2017  . Acute renal failure superimposed on stage 3 chronic kidney disease (HCC) 10/13/2017  . Fracture of left humerus 07/21/2012  . HTN (hypertension) 07/21/2012  . Hypothyroidism 07/21/2012  . Aortic aneurysm (HCC) 07/21/2012    CMP     Component Value Date/Time   NA 141 05/04/2018   K 4.6 05/04/2018   CL 106 10/18/2017 0242   CO2 21 (L) 10/18/2017 0242   GLUCOSE 101 (H) 10/18/2017 0242   BUN 22 (A) 05/04/2018   CREATININE 0.80 05/04/2018   CALCIUM 8.7 05/04/2018   PROT 6.6 05/04/2018   ALBUMIN 3.4 05/04/2018   AST 17 05/04/2018   ALT 10 05/04/2018   ALKPHOS 75 05/04/2018   BILITOT 0.3 05/04/2018   GFRNONAA 60.85 05/04/2018   GFRAA 42 (L) 10/18/2017 0242   Recent Labs    10/16/17 0615 10/17/17 0223 10/18/17 0242 10/26/17 05/04/18  NA 147* 151* 146* 145 141  K 3.7 3.1* 3.7 4.1 4.6  CL 114* 113* 106  --   --   CO2 16* 23 21*  --   --   GLUCOSE 91 117* 101*  --   --   BUN 48* 45* 43* 20 22*  CREATININE 1.42* 1.32* 1.19* 0.6 0.80  CALCIUM 8.2* 8.9 8.7*  --  8.7   Recent Labs    10/13/17 1243 05/04/18  AST 64* 17  ALT 72* 10  ALKPHOS 111 75  BILITOT 1.3* 0.3  PROT 7.7 6.6  ALBUMIN  4.0 3.4   Recent Labs    10/13/17 1243  10/15/17 0303 10/16/17 0615 10/26/17 05/04/18  WBC 8.8   < > 11.1* 11.0* 9.0 6.6  10-20  NEUTROABS 7.3  --   --   --   --   --   HGB 12.0   < > 12.5 11.8* 15.0 12.3  HCT 36.8   < > 38.3 38.4 48* 38.6  MCV 97.4   < > 98.5 99.0  --  97.6  PLT 289   < > 187 172 326  --    < > = values in this interval not displayed.   Recent  Labs    10/14/17 0226  CHOL 145  LDLCALC 88  TRIG 116   No results found for: [facility] Lab Results  Component Value Date   TSH 2.146 10/13/2017   Lab Results  Component Value Date   HGBA1C 5.8 (H) 10/14/2017   Lab Results  Component Value Date   CHOL 145 10/14/2017   HDL 34 (L) 10/14/2017   LDLCALC 88 10/14/2017   TRIG 116 10/14/2017   CHOLHDL 4.3 10/14/2017    Significant Diagnostic Results in last 30 days:  No results found.  Assessment and Plan  Coronary artery disease Patient on no meds; patient has not had chest pain; patient has sublingual nitroglycerin as needed  History of right MCA stroke Patient is off of all meds by choice and doing well; continue supportive care  Atrial fibrillation (HCC) Rate controlled on no medications, patient off of all anticoagulants secondary to age by choice; continue supportive care    Thurston Hole D. Lyn Hollingshead, MD

## 2018-08-06 ENCOUNTER — Encounter: Payer: Self-pay | Admitting: Internal Medicine

## 2018-08-06 NOTE — Assessment & Plan Note (Signed)
Patient is off of all meds by choice and doing well; continue supportive care

## 2018-08-06 NOTE — Assessment & Plan Note (Signed)
Rate controlled on no medications, patient off of all anticoagulants secondary to age by choice; continue supportive care

## 2018-08-06 NOTE — Assessment & Plan Note (Signed)
Patient on no meds; patient has not had chest pain; patient has sublingual nitroglycerin as needed

## 2018-08-11 ENCOUNTER — Encounter: Payer: Medicare Other | Admitting: Internal Medicine

## 2018-08-28 ENCOUNTER — Encounter: Payer: Self-pay | Admitting: Internal Medicine

## 2018-08-28 ENCOUNTER — Non-Acute Institutional Stay: Payer: Medicare Other | Admitting: Internal Medicine

## 2018-08-28 ENCOUNTER — Non-Acute Institutional Stay (SKILLED_NURSING_FACILITY): Payer: Medicare Other | Admitting: Internal Medicine

## 2018-08-28 DIAGNOSIS — Z8673 Personal history of transient ischemic attack (TIA), and cerebral infarction without residual deficits: Secondary | ICD-10-CM | POA: Diagnosis not present

## 2018-08-28 DIAGNOSIS — I4891 Unspecified atrial fibrillation: Secondary | ICD-10-CM | POA: Diagnosis not present

## 2018-08-28 DIAGNOSIS — I25118 Atherosclerotic heart disease of native coronary artery with other forms of angina pectoris: Secondary | ICD-10-CM

## 2018-08-28 NOTE — Progress Notes (Signed)
[facility] Room Number: [phone] Place of Service:  SNF (31)  Randon Goldsmith. Lyn Hollingshead, MD  Patient Care Team: [name], MD as PCP - General (Internal Medicine)  Extended Emergency Contact Information Primary Emergency Contact: Piccini,Scott Mobile Phone: [phone] Relation: Son Secondary Emergency Contact: Mora [place] Home Phone: [phone] Relation: Son    Allergies: Sulfa antibiotics  Chief Complaint  Patient presents with  . Medical Management of Chronic Issues    Routine Visit  . Health Maintenance    Influenza vacc.    HPI: Patient is 84 y.o. female who has become palliative care/hospice is being seen for routine issues of CAD, history of right MCA stroke, atrial fib.  Past Medical History:  Diagnosis Date  . Acute kidney injury (HCC)   . Acute lower UTI 10/13/2017  . Acute renal failure superimposed on stage 3 chronic kidney disease (HCC) 10/13/2017  . Acute right MCA stroke (HCC) 10/13/2017  . Angina at rest [facility])   . Aortic aneurysm (HCC)   . Coronary artery disease   . Dysphagia   . Fracture of left humerus 07/21/2012  . HTN (hypertension) 07/21/2012  . Hypertension   . Hypothyroid   . Hypothyroidism 07/21/2012  . Toxic metabolic encephalopathy 10/13/2017    Past Surgical History:  Procedure Laterality Date  . ABDOMINAL HYSTERECTOMY    . BLADDER SURGERY    . CAROTID ARTERY ANGIOPLASTY    . CATARACT EXTRACTION      Allergies as of 08/28/2018      Reactions   Sulfa Antibiotics    unknown      Medication List        Accurate as of 08/28/18 11:59 PM. Always use your most recent med list.          DIALYVITE VITAMIN D3 MAX 21308 units Tabs Generic drug:  Cholecalciferol Take 1 capsule by mouth every 30 (thirty) days.   levothyroxine 50 MCG tablet Commonly known as:  SYNTHROID, LEVOTHROID Take 50 mcg by mouth daily before breakfast.   multivitamin with  minerals Tabs tablet Take 1 tablet by mouth daily.   nitroGLYCERIN 0.4 MG SL tablet Commonly known as:  NITROSTAT Place 0.4 mg under the tongue every 5 (five) minutes as needed. For chest pain   NUTRITIONAL SUPPLEMENT Liqd Take Magic Cup by mouth 3 times daily for weight support   ondansetron 4 MG tablet Commonly known as:  ZOFRAN Take 4 mg by mouth every 6 (six) hours as needed for nausea or vomiting.   UNABLE TO FIND 60 mLs 3 (three) times daily. Med Name: Medpass       No orders of the defined types were placed in this encounter.   Immunization History  Administered Date(s) Administered  . Pneumococcal Conjugate-13 07/16/2017  . Pneumococcal Polysaccharide-23 03/17/2018    Social History   Tobacco Use  . Smoking status: Former Games developer  . Smokeless tobacco: Never Used  Substance Use Topics  . Alcohol use: No    Review of Systems  DATA OBTAINED: from patient-limited; nursing- only concern is poor appetite GENERAL:  no fevers, fatigue, poor appetite SKIN: No itching, rash HEENT: No complaint RESPIRATORY: No cough, wheezing, SOB CARDIAC: No chest pain, palpitations, lower extremity edema  GI: No abdominal pain, No N/V/D or constipation, No heartburn or reflux  GU: No dysuria, frequency or urgency, or incontinence  MUSCULOSKELETAL: No unrelieved bone/joint pain NEUROLOGIC:  No headache, dizziness  PSYCHIATRIC: No overt anxiety or sadness  Vitals:   08/28/18 1520  BP: 102/77  Pulse: (!) 51  Resp: 18  Temp: (!) 96.2 F (35.7 C)   Body mass index is 17.22 kg/m. Physical Exam  GENERAL APPEARANCE: Alert, No acute distress  SKIN: No diaphoresis rash HEENT: Unremarkable RESPIRATORY: Breathing is even, unlabored. Lung sounds are clear   CARDIOVASCULAR: Heart RRR no murmurs, rubs or gallops. No peripheral edema  GASTROINTESTINAL: Abdomen is soft, non-tender, not distended w/ normal bowel sounds.  GENITOURINARY: Bladder non tender, not distended    MUSCULOSKELETAL: No abnormal joints or musculature I am a very thin weight NEUROLOGIC: Cranial nerves 2-12 grossly intact. Moves all extremities PSYCHIATRIC: Mood and affect-limited communication, no behavioral issues  Patient Active Problem List   Diagnosis Date Noted  . Advanced care planning/counseling discussion 06/12/2018  . Weakness 06/11/2018  . Loss of appetite 06/11/2018  . Atrial fibrillation (HCC) 01/08/2018  . History of right MCA stroke 01/08/2018  . Coronary artery disease 01/08/2018  . Acute respiratory failure with hypoxia (HCC) 10/23/2017  . E. coli UTI 10/23/2017  . Dysphagia   . Counseling regarding advanced care planning and goals of care   . Hyperlipidemia   . Acute kidney injury (HCC)   . Atrial fibrillation with RVR (HCC)   . Palliative care by specialist   . Acute right MCA stroke (HCC) 10/13/2017  . Acute lower UTI 10/13/2017  . Lactic acidosis 10/13/2017  . Toxic metabolic encephalopathy 10/13/2017  . Acute renal failure superimposed on stage 3 chronic kidney disease (HCC) 10/13/2017  . Fracture of left humerus 07/21/2012  . HTN (hypertension) 07/21/2012  . Hypothyroidism 07/21/2012  . Aortic aneurysm (HCC) 07/21/2012    CMP     Component Value Date/Time   NA 141 05/04/2018   K 4.6 05/04/2018   CL 106 10/18/2017 0242   CO2 21 (L) 10/18/2017 0242   GLUCOSE 101 (H) 10/18/2017 0242   BUN 22 (A) 05/04/2018   CREATININE 0.80 05/04/2018   CALCIUM 8.7 05/04/2018   PROT 6.6 05/04/2018   ALBUMIN 3.4 05/04/2018   AST 17 05/04/2018   ALT 10 05/04/2018   ALKPHOS 75 05/04/2018   BILITOT 0.3 05/04/2018   GFRNONAA 60.85 05/04/2018   GFRAA 42 (L) 10/18/2017 0242   Recent Labs    10/16/17 0615 10/17/17 0223 10/18/17 0242 10/26/17 05/04/18  NA 147* 151* 146* 145 141  K 3.7 3.1* 3.7 4.1 4.6  CL 114* 113* 106  --   --   CO2 16* 23 21*  --   --   GLUCOSE 91 117* 101*  --   --   BUN 48* 45* 43* 20 22*  CREATININE 1.42* 1.32* 1.19* 0.6 0.80  CALCIUM  8.2* 8.9 8.7*  --  8.7   Recent Labs    10/13/17 1243 05/04/18  AST 64* 17  ALT 72* 10  ALKPHOS 111 75  BILITOT 1.3* 0.3  PROT 7.7 6.6  ALBUMIN 4.0 3.4   Recent Labs    10/13/17 1243  10/15/17 0303 10/16/17 0615 10/26/17 05/04/18  WBC 8.8   < > 11.1* 11.0* 9.0 6.6  10-20  NEUTROABS 7.3  --   --   --   --   --   HGB 12.0   < > 12.5 11.8* 15.0 12.3  HCT 36.8   < > 38.3 38.4 48* 38.6  MCV 97.4   < > 98.5 99.0  --  97.6  PLT 289   < >  187 172 326  --    < > = values in this interval not displayed.   Recent Labs    10/14/17 0226  CHOL 145  LDLCALC 88  TRIG 116   No results found for: [facility] Lab Results  Component Value Date   TSH 2.146 10/13/2017   Lab Results  Component Value Date   HGBA1C 5.8 (H) 10/14/2017   Lab Results  Component Value Date   CHOL 145 10/14/2017   HDL 34 (L) 10/14/2017   LDLCALC 88 10/14/2017   TRIG 116 10/14/2017   CHOLHDL 4.3 10/14/2017    Significant Diagnostic Results in last 30 days:  No results found.  Assessment and Plan  History of right MCA stroke Patient off all meds by choice; continue supportive care  Atrial fibrillation [facility]) Patient is off all meds; continue supportive care  Coronary artery disease- patient on no meds; patient with no chest pain; patient has sublingual nitroglycerin as needed     Thurston Hole D. Lyn Hollingshead, MD

## 2018-08-30 NOTE — Progress Notes (Signed)
PALLIATIVE CARE CONSULT VISIT   PATIENT NAME: Jennifer Zavala DOB: 04-29-1916 MRN: [id number]  PRIMARY CARE PROVIDER:   [name], MD  REFERRING PROVIDER:  [name], MD [address] [place], [place] [address]  RESPONSIBLE PARTY:   Tyler Aas)  [phone]    RECOMMENDATIONS and PLAN:   1.  Weakness R 53.1:  Chronic related to poor nutritional state and advanced age.  Continue to provide supportive care and comfort measures  2.  Loss of appetite R63.0:  Progressive decline with visible weight loss.  Pt continues to refuse meals.  Continue to offer comfort foods and beverages prn.  No expectation for improvement.  3.  Palliative care encounter Z51.5  :  DNR/DNI as per previous discussion with son Lorin Picket, who is interested in comfort measures.  He agrees with no recurrent IV infusion or return to hospital.  She is appropriate for Hospice care and has been receiving comfort measures for several weeks.  Continue comfort/supportive measures by staff.  Transition to Hospice care if decided upon by family.  Palliative care will continue to follow  I spent 15 minutes providing this consultation,  from 2:15pm to pm:. 2:30pm at [facility].  More than 50% of the time in this consultation was spent coordinating communication with clinical staff.   HISTORY OF PRESENT ILLNESS: Followup with Jennifer Zavala . Staff reports that patient continues to eat minimal bites and sips. Reports of increased sleeping during the day. She spends some time in wheelchair for meals but requests to return to bed often.  She remains total care and without falls or hospitalizations.  Family has already had a 89 yr birthday celebration in light of her declining health.    CODE STATUS: DNR/DNI  PPS: 30%  In wc for meals HOSPICE ELIGIBILITY/DIAGNOSIS: YES/ Protein calorie malnutrition  PAST MEDICAL HISTORY:  Past Medical History:  Diagnosis Date  . Acute kidney injury (HCC)   .  Acute lower UTI 10/13/2017  . Acute renal failure superimposed on stage 3 chronic kidney disease (HCC) 10/13/2017  . Acute right MCA stroke (HCC) 10/13/2017  . Angina at rest [facility])   . Aortic aneurysm (HCC)   . Coronary artery disease   . Dysphagia   . Fracture of left humerus 07/21/2012  . HTN (hypertension) 07/21/2012  . Hypertension   . Hypothyroid   . Hypothyroidism 07/21/2012  . Toxic metabolic encephalopathy 10/13/2017    SOCIAL HX:  Social History   Tobacco Use  . Smoking status: Former Games developer  . Smokeless tobacco: Never Used  Substance Use Topics  . Alcohol use: No    ALLERGIES:  Allergies  Allergen Reactions  . Sulfa Antibiotics     unknown     PERTINENT MEDICATIONS:  Outpatient Encounter Medications as of 05/30/2018  Medication Sig  . apixaban (ELIQUIS) 2.5 MG TABS tablet Take 2.5 mg by mouth 2 (two) times daily.   . Cholecalciferol (DIALYVITE VITAMIN D3 MAX) 86578 units TABS Take 1 capsule by mouth every 30 (thirty) days.  Marland Kitchen levothyroxine (SYNTHROID, LEVOTHROID) 50 MCG tablet Take 50 mcg by mouth daily before breakfast.  . Multiple Vitamin (MULTIVITAMIN WITH MINERALS) TABS Take 1 tablet by mouth daily.  . nitroGLYCERIN (NITROSTAT) 0.4 MG SL tablet Place 0.4 mg under the tongue every 5 (five) minutes as needed. For chest pain  . NUTRITIONAL SUPPLEMENT LIQD Take Magic Cup by mouth 3 times daily for weight support  . UNABLE TO FIND 60 mLs 3 (three)  times daily. Med Name: Medpass   No facility-administered encounter medications on file as of 05/30/2018.     PHYSICAL EXAM:   General: Frail and fragile appearing, thin elderly female resting in wc.Apparent weight loss. Eager to return to bed Cardiovascular: regular rate and rhythm Pulmonary: clear ant fields.  No increased respiratory effort Abdomen: soft, nontender, + bowel sounds GU: no suprapubic tenderness Extremities: no edema, muscular atrophy.  Increased skeletal visability Skin: Pale in color and cool to  touch Neurological: Alert and oriented to self  Generalized weakness.  Follows commands.  Speaks in sentences. Psych:  Anxious mood(desiring to return to bed)  Margaretha Sheffield, NP-C

## 2018-09-05 NOTE — Progress Notes (Signed)
  Erroneous encounter.  Margaretha Sheffield, NP-C

## 2018-09-15 ENCOUNTER — Encounter: Payer: Self-pay | Admitting: Internal Medicine

## 2018-09-16 NOTE — Assessment & Plan Note (Signed)
Patient is off all meds; continue supportive care

## 2018-09-16 NOTE — Assessment & Plan Note (Signed)
Patient off all meds by choice; continue supportive care

## 2018-09-29 ENCOUNTER — Non-Acute Institutional Stay (SKILLED_NURSING_FACILITY): Payer: Medicare Other | Admitting: Internal Medicine

## 2018-09-29 ENCOUNTER — Encounter: Payer: Self-pay | Admitting: Internal Medicine

## 2018-09-29 DIAGNOSIS — E034 Atrophy of thyroid (acquired): Secondary | ICD-10-CM

## 2018-09-29 DIAGNOSIS — I10 Essential (primary) hypertension: Secondary | ICD-10-CM | POA: Diagnosis not present

## 2018-09-29 DIAGNOSIS — E785 Hyperlipidemia, unspecified: Secondary | ICD-10-CM | POA: Diagnosis not present

## 2018-09-29 NOTE — Progress Notes (Signed)
[facility] Room Number: 206D Place of Service:  SNF [phone]  Jennifer Goldsmithnne D. Lyn HollingsheadAlexander, MD  Patient Care Team: Margit HanksAlexander, Rodolfo Gaster D, MD as PCP - General (Internal Medicine)  Extended Emergency Contact Information Primary Emergency Contact: Scarpelli,Scott Mobile Phone: [phone] Relation: Son Secondary Emergency Contact: Mora BellmanGibson,Ed          [place] Home Phone: [phone] Relation: Son    Allergies: Sulfa antibiotics  Chief Complaint  Patient presents with  . Medical Management of Chronic Issues    Routine Visit  . Health Maintenance    Influenza vacc.    HPI: Patient is [age] y.o. female who is being seen for routine issues of hypertension, hypothyroidism and hyperlipidemia.  Past Medical History:  Diagnosis Date  . Acute kidney injury (HCC)   . Acute lower UTI 10/13/2017  . Acute renal failure superimposed on stage 3 chronic kidney disease (HCC) 10/13/2017  . Acute right MCA stroke (HCC) 10/13/2017  . Angina at rest [facility](HCC)   . Aortic aneurysm (HCC)   . Coronary artery disease   . Dysphagia   . Fracture of left humerus 07/21/2012  . HTN (hypertension) 07/21/2012  . Hypertension   . Hypothyroid   . Hypothyroidism 07/21/2012  . Toxic metabolic encephalopathy 10/13/2017    Past Surgical History:  Procedure Laterality Date  . ABDOMINAL HYSTERECTOMY    . BLADDER SURGERY    . CAROTID ARTERY ANGIOPLASTY    . CATARACT EXTRACTION      Allergies as of 09/29/2018      Reactions   Sulfa Antibiotics    unknown      Medication List        Accurate as of 09/29/18 11:59 PM. Always use your most recent med list.          DIALYVITE VITAMIN D3 MAX 1.25 MG (50000 UT) Tabs Generic drug:  Cholecalciferol Take 1 capsule by mouth every 30 (thirty) days.   levothyroxine 75 MCG tablet Commonly known as:  SYNTHROID, LEVOTHROID Take 75 mcg by mouth daily before breakfast.   multivitamin with minerals Tabs  tablet Take 1 tablet by mouth daily.   nitroGLYCERIN 0.4 MG SL tablet Commonly known as:  NITROSTAT Place 0.4 mg under the tongue every 5 (five) minutes as needed. For chest pain   NUTRITIONAL SUPPLEMENT Liqd Take Magic Cup by mouth 3 times daily for weight support   ondansetron 4 MG tablet Commonly known as:  ZOFRAN Take 4 mg by mouth every 6 (six) hours as needed for nausea or vomiting.   UNABLE TO FIND 60 mLs 3 (three) times daily. Med Name: Medpass       No orders of the defined types were placed in this encounter.   Immunization History  Administered Date(s) Administered  . Pneumococcal Conjugate-13 07/16/2017  . Pneumococcal Polysaccharide-23 03/17/2018    Social History   Tobacco Use  . Smoking status: Former Games developermoker  . Smokeless tobacco: Never Used  Substance Use Topics  . Alcohol use: No    Review of Systems  DATA OBTAINED: from patient-very limited; nursing- concern for continued poor appetite GENERAL:  no fevers, fatigue, appetite changes SKIN: No itching, rash HEENT: No complaint RESPIRATORY: No cough, wheezing, SOB CARDIAC: No chest pain, palpitations, lower extremity edema  GI: No abdominal pain, No N/V/D or constipation, No heartburn or reflux  GU: No dysuria, frequency or urgency, or incontinence  MUSCULOSKELETAL: No unrelieved bone/joint pain NEUROLOGIC: No headache, dizziness  PSYCHIATRIC:  No overt anxiety or sadness  Vitals:   09/29/18 1335  BP: 105/63  Pulse: 78  Resp: 18  Temp: 98 F (36.7 C)   Body mass index is 16.49 kg/m. Physical Exam  GENERAL APPEARANCE: Alert,  No acute distress, sitting up in bed smiling at me SKIN: No diaphoresis rash HEENT: Unremarkable RESPIRATORY: Breathing is even, unlabored. Lung sounds are clear   CARDIOVASCULAR: Heart RRR no murmurs, rubs or gallops. No peripheral edema  GASTROINTESTINAL: Abdomen is soft, non-tender, not distended w/ normal bowel sounds.  GENITOURINARY: Bladder non tender, not  distended  MUSCULOSKELETAL: No abnormal joints or musculature NEUROLOGIC: Cranial nerves 2-12 grossly intact. Moves all extremities PSYCHIATRIC: Mood and affect calm no behavioral issues  Patient Active Problem List   Diagnosis Date Noted  . Advanced care planning/counseling discussion 06/12/2018  . Weakness 06/11/2018  . Loss of appetite 06/11/2018  . Atrial fibrillation (HCC) 01/08/2018  . History of right MCA stroke 01/08/2018  . Coronary artery disease 01/08/2018  . Acute respiratory failure with hypoxia (HCC) 10/23/2017  . E. coli UTI 10/23/2017  . Dysphagia   . Counseling regarding advanced care planning and goals of care   . Hyperlipidemia   . Acute kidney injury (HCC)   . Atrial fibrillation with RVR (HCC)   . Palliative care by specialist   . Acute right MCA stroke (HCC) 10/13/2017  . Acute lower UTI 10/13/2017  . Lactic acidosis 10/13/2017  . Toxic metabolic encephalopathy 10/13/2017  . Acute renal failure superimposed on stage 3 chronic kidney disease (HCC) 10/13/2017  . Fracture of left humerus 07/21/2012  . HTN (hypertension) 07/21/2012  . Hypothyroidism 07/21/2012  . Aortic aneurysm (HCC) 07/21/2012    CMP     Component Value Date/Time   NA 141 05/04/2018   K 4.6 05/04/2018   CL 106 10/18/2017 0242   CO2 21 (L) 10/18/2017 0242   GLUCOSE 101 (H) 10/18/2017 0242   BUN 22 (A) 05/04/2018   CREATININE 0.80 05/04/2018   CALCIUM 8.7 05/04/2018   PROT 6.6 05/04/2018   ALBUMIN 3.4 05/04/2018   AST 17 05/04/2018   ALT 10 05/04/2018   ALKPHOS 75 05/04/2018   BILITOT 0.3 05/04/2018   GFRNONAA 60.85 05/04/2018   GFRAA 42 (L) 10/18/2017 0242   Recent Labs    10/16/17 0615 10/17/17 0223 10/18/17 0242 10/26/17 05/04/18  NA 147* 151* 146* 145 141  K 3.7 3.1* 3.7 4.1 4.6  CL 114* 113* 106  --   --   CO2 16* 23 21*  --   --   GLUCOSE 91 117* 101*  --   --   BUN 48* 45* 43* 20 22*  CREATININE 1.42* 1.32* 1.19* 0.6 0.80  CALCIUM 8.2* 8.9 8.7*  --  8.7    Recent Labs    10/13/17 1243 05/04/18  AST 64* 17  ALT 72* 10  ALKPHOS 111 75  BILITOT 1.3* 0.3  PROT 7.7 6.6  ALBUMIN 4.0 3.4   Recent Labs    10/13/17 1243  10/15/17 0303 10/16/17 0615 10/26/17 05/04/18  WBC 8.8   < > 11.1* 11.0* 9.0 6.6  10-20  NEUTROABS 7.3  --   --   --   --   --   HGB 12.0   < > 12.5 11.8* 15.0 12.3  HCT 36.8   < > 38.3 38.4 48* 38.6  MCV 97.4   < > 98.5 99.0  --  97.6  PLT 289   < > 187 172 326  --    < > =  values in this interval not displayed.   Recent Labs    10/14/17 0226  CHOL 145  LDLCALC 88  TRIG 116   No results found for: [facility] Lab Results  Component Value Date   TSH 2.146 10/13/2017   Lab Results  Component Value Date   HGBA1C 5.8 (H) 10/14/2017   Lab Results  Component Value Date   CHOL 145 10/14/2017   HDL 34 (L) 10/14/2017   LDLCALC 88 10/14/2017   TRIG 116 10/14/2017   CHOLHDL 4.3 10/14/2017    Significant Diagnostic Results in last 30 days:  No results found.  Assessment and Plan  HTN (hypertension) Controlled on no meds; will monitor at intervals  Hypothyroidism Stable; continue Synthroid 50 mcg daily  Hyperlipidemia Patient on no meds secondary to age and secondary to age does not need to be on cholesterol meds anymore; no need to check lipids in the future     Thurston Hole D. Lyn Hollingshead, MD

## 2018-09-30 LAB — TSH: TSH: 6.08 — AB (ref 0.41–5.90)

## 2018-10-02 ENCOUNTER — Encounter: Payer: Self-pay | Admitting: Internal Medicine

## 2018-10-02 NOTE — Assessment & Plan Note (Signed)
Controlled on no meds; will monitor at intervals

## 2018-10-02 NOTE — Assessment & Plan Note (Signed)
Stable; continue Synthroid 50 mcg daily 

## 2018-10-02 NOTE — Assessment & Plan Note (Signed)
Patient on no meds secondary to age and secondary to age does not need to be on cholesterol meds anymore; no need to check lipids in the future

## 2018-11-23 ENCOUNTER — Other Ambulatory Visit: Payer: Self-pay

## 2018-11-23 ENCOUNTER — Emergency Department (HOSPITAL_COMMUNITY)
Admission: EM | Admit: 2018-11-23 | Discharge: 2018-12-16 | Disposition: E | Payer: Medicare Other | Attending: Emergency Medicine | Admitting: Emergency Medicine

## 2018-11-23 ENCOUNTER — Emergency Department (HOSPITAL_COMMUNITY): Payer: Medicare Other

## 2018-11-23 ENCOUNTER — Encounter (HOSPITAL_COMMUNITY): Payer: Self-pay | Admitting: *Deleted

## 2018-11-23 DIAGNOSIS — Z87891 Personal history of nicotine dependence: Secondary | ICD-10-CM | POA: Diagnosis not present

## 2018-11-23 DIAGNOSIS — N183 Chronic kidney disease, stage 3 (moderate): Secondary | ICD-10-CM | POA: Diagnosis not present

## 2018-11-23 DIAGNOSIS — N390 Urinary tract infection, site not specified: Secondary | ICD-10-CM | POA: Insufficient documentation

## 2018-11-23 DIAGNOSIS — R55 Syncope and collapse: Secondary | ICD-10-CM | POA: Diagnosis present

## 2018-11-23 DIAGNOSIS — I2510 Atherosclerotic heart disease of native coronary artery without angina pectoris: Secondary | ICD-10-CM | POA: Diagnosis not present

## 2018-11-23 DIAGNOSIS — I130 Hypertensive heart and chronic kidney disease with heart failure and stage 1 through stage 4 chronic kidney disease, or unspecified chronic kidney disease: Secondary | ICD-10-CM | POA: Insufficient documentation

## 2018-11-23 DIAGNOSIS — Z79899 Other long term (current) drug therapy: Secondary | ICD-10-CM | POA: Diagnosis not present

## 2018-11-23 DIAGNOSIS — Z8673 Personal history of transient ischemic attack (TIA), and cerebral infarction without residual deficits: Secondary | ICD-10-CM | POA: Insufficient documentation

## 2018-11-23 DIAGNOSIS — E039 Hypothyroidism, unspecified: Secondary | ICD-10-CM | POA: Insufficient documentation

## 2018-11-23 DIAGNOSIS — I5084 End stage heart failure: Secondary | ICD-10-CM | POA: Diagnosis not present

## 2018-11-23 LAB — URINALYSIS, ROUTINE W REFLEX MICROSCOPIC
BILIRUBIN URINE: NEGATIVE
Glucose, UA: NEGATIVE mg/dL
Hgb urine dipstick: NEGATIVE
Ketones, ur: NEGATIVE mg/dL
Nitrite: NEGATIVE
PH: 7 (ref 5.0–8.0)
Protein, ur: 100 mg/dL — AB
SPECIFIC GRAVITY, URINE: 1.013 (ref 1.005–1.030)

## 2018-11-23 LAB — CBC WITH DIFFERENTIAL/PLATELET
ABS IMMATURE GRANULOCYTES: 0.2 10*3/uL — AB (ref 0.00–0.07)
BASOS ABS: 0 10*3/uL (ref 0.0–0.1)
Band Neutrophils: 6 %
Basophils Relative: 0 %
Eosinophils Absolute: 0 10*3/uL (ref 0.0–0.5)
Eosinophils Relative: 0 %
HCT: 42.7 % (ref 36.0–46.0)
Hemoglobin: 12.6 g/dL (ref 12.0–15.0)
LYMPHS ABS: 1 10*3/uL (ref 0.7–4.0)
Lymphocytes Relative: 34 %
MCH: 30.9 pg (ref 26.0–34.0)
MCHC: 29.5 g/dL — AB (ref 30.0–36.0)
MCV: 104.7 fL — ABNORMAL HIGH (ref 80.0–100.0)
METAMYELOCYTES PCT: 8 %
Monocytes Absolute: 0.2 10*3/uL (ref 0.1–1.0)
Monocytes Relative: 6 %
Neutro Abs: 1.5 10*3/uL — ABNORMAL LOW (ref 1.7–7.7)
Neutrophils Relative %: 46 %
Platelets: 256 10*3/uL (ref 150–400)
RBC: 4.08 MIL/uL (ref 3.87–5.11)
RDW: 14.7 % (ref 11.5–15.5)
WBC: 2.9 10*3/uL — ABNORMAL LOW (ref 4.0–10.5)
nRBC: 0 % (ref 0.0–0.2)
nRBC: 2 /100 WBC — ABNORMAL HIGH

## 2018-11-23 LAB — COMPREHENSIVE METABOLIC PANEL
ALBUMIN: 2.6 g/dL — AB (ref 3.5–5.0)
ALT: 12 U/L (ref 0–44)
AST: 31 U/L (ref 15–41)
Alkaline Phosphatase: 60 U/L (ref 38–126)
Anion gap: 17 — ABNORMAL HIGH (ref 5–15)
BUN: 34 mg/dL — ABNORMAL HIGH (ref 8–23)
CALCIUM: 8.4 mg/dL — AB (ref 8.9–10.3)
CO2: 13 mmol/L — ABNORMAL LOW (ref 22–32)
Chloride: 109 mmol/L (ref 98–111)
Creatinine, Ser: 1.67 mg/dL — ABNORMAL HIGH (ref 0.44–1.00)
GFR calc Af Amer: 29 mL/min — ABNORMAL LOW (ref >60)
GFR calc non Af Amer: 25 mL/min — ABNORMAL LOW (ref >60)
Glucose, Bld: 102 mg/dL — ABNORMAL HIGH (ref 70–99)
Potassium: 4 mmol/L (ref 3.5–5.1)
Sodium: 139 mmol/L (ref 135–145)
Total Bilirubin: 1 mg/dL (ref 0.3–1.2)
Total Protein: 6.3 g/dL — ABNORMAL LOW (ref 6.5–8.1)

## 2018-11-23 MED ORDER — HYDROMORPHONE HCL 1 MG/ML IJ SOLN
0.5000 mg | INTRAMUSCULAR | Status: DC | PRN
  Administered 2018-11-23: 0.5 mg via INTRAVENOUS
  Filled 2018-11-23: qty 1

## 2018-11-23 MED ORDER — SODIUM CHLORIDE 0.9 % IV SOLN
1.0000 g | Freq: Once | INTRAVENOUS | Status: DC
  Administered 2018-11-23: 1 g via INTRAVENOUS
  Filled 2018-11-23: qty 10

## 2018-11-23 MED ORDER — GLYCOPYRROLATE 0.2 MG/ML IJ SOLN: 0.2000 mg | INTRAMUSCULAR | Status: DC | PRN

## 2018-11-23 MED ORDER — HYDROMORPHONE HCL 1 MG/ML IJ SOLN
0.2500 mg | INTRAMUSCULAR | Status: DC | PRN
  Administered 2018-11-23: 0.25 mg via INTRAVENOUS
  Filled 2018-11-23: qty 1

## 2018-11-23 MED ORDER — LORAZEPAM 2 MG/ML IJ SOLN: 0.5000 mg | INTRAMUSCULAR | Status: DC | PRN

## 2018-11-23 MED ORDER — HYDROMORPHONE HCL 1 MG/ML IJ SOLN
0.2500 mg | INTRAMUSCULAR | Status: DC | PRN
  Filled 2018-11-23: qty 1

## 2018-11-23 MED ORDER — LORAZEPAM 2 MG/ML IJ SOLN
0.5000 mg | INTRAMUSCULAR | Status: DC | PRN
  Filled 2018-11-23: qty 1

## 2018-12-16 NOTE — ED Notes (Signed)
Family at beside. Talking with palliative care RN .

## 2018-12-16 NOTE — ED Provider Notes (Signed)
[facility] EMERGENCY DEPARTMENT Provider Note   CSN: [id number] Arrival date & time: 12/12/2018  1505     History   Chief Complaint Chief Complaint  Patient presents with  . Altered Mental Status    HPI Jennifer Zavala is a 85 y.o. female.  HPI Patient brought in from nursing home after being found unresponsive by staff.  Noted to be hypotensive and hypoxic by EMS.  Improved with IV fluids and supplemental oxygen.  Patient has DNR paperwork at bedside.  Level 5 caveat. Past Medical History:  Diagnosis Date  . Acute kidney injury (HCC)   . Acute lower UTI 10/13/2017  . Acute renal failure superimposed on stage 3 chronic kidney disease (HCC) 10/13/2017  . Acute right MCA stroke (HCC) 10/13/2017  . Angina at rest [facility])   . Aortic aneurysm (HCC)   . Coronary artery disease   . Dysphagia   . Fracture of left humerus 07/21/2012  . HTN (hypertension) 07/21/2012  . Hypertension   . Hypothyroid   . Hypothyroidism 07/21/2012  . Toxic metabolic encephalopathy 10/13/2017    Patient Active Problem List   Diagnosis Date Noted  . Advanced care planning/counseling discussion 06/12/2018  . Weakness 06/11/2018  . Loss of appetite 06/11/2018  . Atrial fibrillation (HCC) 01/08/2018  . History of right MCA stroke 01/08/2018  . Coronary artery disease 01/08/2018  . Acute respiratory failure with hypoxia (HCC) 10/23/2017  . E. coli UTI 10/23/2017  . Dysphagia   . Counseling regarding advanced care planning and goals of care   . Hyperlipidemia   . Acute kidney injury (HCC)   . Atrial fibrillation with RVR (HCC)   . Palliative care by specialist   . Acute right MCA stroke (HCC) 10/13/2017  . Acute lower UTI 10/13/2017  . Lactic acidosis 10/13/2017  . Toxic metabolic encephalopathy 10/13/2017  . Acute renal failure superimposed on stage 3 chronic kidney disease (HCC) 10/13/2017  . Fracture of left humerus 07/21/2012  . HTN (hypertension) 07/21/2012  . Hypothyroidism  07/21/2012  . Aortic aneurysm (HCC) 07/21/2012    Past Surgical History:  Procedure Laterality Date  . ABDOMINAL HYSTERECTOMY    . BLADDER SURGERY    . CAROTID ARTERY ANGIOPLASTY    . CATARACT EXTRACTION       OB History   No obstetric history on file.      Home Medications    Prior to Admission medications   Medication Sig Start Date End Date Taking? Authorizing Provider  Cholecalciferol (DIALYVITE VITAMIN D3 MAX) 69794 units TABS Take 1 capsule by mouth every 30 (thirty) days.   Yes [provider]  levothyroxine (SYNTHROID, LEVOTHROID) 75 MCG tablet Take 75 mcg by mouth daily before breakfast.   Yes [provider]  Multiple Vitamin (MULTIVITAMIN WITH MINERALS) TABS Take 1 tablet by mouth daily.   Yes [provider]  nitroGLYCERIN (NITROSTAT) 0.4 MG SL tablet Place 0.4 mg under the tongue every 5 (five) minutes as needed. For chest pain   Yes [provider]  NUTRITIONAL SUPPLEMENT LIQD Take 1 each by mouth 3 (three) times daily. Magic Cup for weight support   Yes [provider]  ondansetron (ZOFRAN) 4 MG tablet Take 4 mg by mouth every 6 (six) hours as needed for nausea or vomiting.   Yes [provider]  UNABLE TO FIND Take 120 mLs by mouth 3 (three) times daily. Med Name: Medpass    Yes [provider]    Family History No  family history on file.  Social History Social History   Tobacco Use  . Smoking status: Former Games developermoker  . Smokeless tobacco: Never Used  Substance Use Topics  . Alcohol use: No  . Drug use: No     Allergies   Sulfa antibiotics   Review of Systems Review of Systems  Unable to perform ROS: Mental status change     Physical Exam Updated Vital Signs BP (!) 60/38 (BP Location: Right Arm)   Pulse 68   Temp (!) 96.5 F (35.8 C) (Rectal)   Resp (!) 24   SpO2 93%   Physical Exam Vitals signs and nursing note reviewed.  Constitutional:      Appearance: She is  well-developed. She is ill-appearing.  HENT:     Head: Normocephalic and atraumatic.  Eyes:     Pupils: Pupils are equal, round, and reactive to light.  Neck:     Musculoskeletal: Normal range of motion and neck supple. No neck rigidity.     Vascular: No carotid bruit.  Cardiovascular:     Rate and Rhythm: Normal rate and regular rhythm.  Pulmonary:     Effort: Pulmonary effort is normal.     Breath sounds: Rales present.  Abdominal:     General: Bowel sounds are normal.     Palpations: Abdomen is soft.     Tenderness: There is no abdominal tenderness. There is no guarding or rebound.  Musculoskeletal: Normal range of motion.        General: No tenderness.     Right lower leg: No edema.     Left lower leg: No edema.  Lymphadenopathy:     Cervical: No cervical adenopathy.  Skin:    General: Skin is warm and dry.     Findings: No erythema or rash.  Neurological:     Comments: Making moaning sounds.  Appears to move all extremities.      ED Treatments / Results  Labs (all labs ordered are listed, but only abnormal results are displayed) Labs Reviewed  CBC WITH DIFFERENTIAL/PLATELET - Abnormal; Notable for the following components:      Result Value   WBC 2.9 (*)    MCV 104.7 (*)    MCHC 29.5 (*)    Neutro Abs 1.5 (*)    nRBC 2 (*)    Abs Immature Granulocytes 0.20 (*)    All other components within normal limits  COMPREHENSIVE METABOLIC PANEL - Abnormal; Notable for the following components:   CO2 13 (*)    Glucose, Bld 102 (*)    BUN 34 (*)    Creatinine, Ser 1.67 (*)    Calcium 8.4 (*)    Total Protein 6.3 (*)    Albumin 2.6 (*)    GFR calc non Af Amer 25 (*)    GFR calc Af Amer 29 (*)    Anion gap 17 (*)    All other components within normal limits  URINALYSIS, ROUTINE W REFLEX MICROSCOPIC - Abnormal; Notable for the following components:   Color, Urine AMBER (*)    APPearance CLOUDY (*)    Protein, ur 100 (*)    Leukocytes, UA MODERATE (*)    Bacteria,  UA MANY (*)    All other components within normal limits    EKG None  Radiology Dg Chest Port 1 View  Result Date: 12/05/2018 CLINICAL DATA:  Shortness of breath. EXAM: PORTABLE CHEST 1 VIEW COMPARISON:  Chest x-ray 10/16/2017. FINDINGS: Cardiomegaly with diffuse bilateral pulmonary infiltrates consistent  with pulmonary edema. Bilateral pneumonia can not be excluded. Small right pleural effusion. No pneumothorax. Diffuse osteopenia. Degenerative change thoracic spine. Prominent rounded calcific density noted projected over the left upper abdomen, abdominal series can be obtained to further evaluate. IMPRESSION: 1. Cardiomegaly with diffuse bilateral pulmonary infiltrates consistent with pulmonary edema. Bilateral pneumonia can not be excluded. 2. Prominent rounded calcific density noted projected over the left upper abdomen. Abdominal series can be obtained to further evaluate. Electronically Signed   By: Maisie Fus  Register   On: 12-01-18 08:26    Procedures Procedures (including critical care time)  Medications Ordered in ED Medications  glycopyrrolate (ROBINUL) injection 0.2 mg (has no administration in time range)  LORazepam (ATIVAN) injection 0.5 mg (has no administration in time range)  HYDROmorphone (DILAUDID) injection 0.5 mg (0.5 mg Intravenous Given 2018/12/01 1122)     Initial Impression / Assessment and Plan / ED Course  I have reviewed the triage vital signs and the nursing notes.  Pertinent labs & imaging results that were available during my care of the patient were reviewed by me and considered in my medical decision making (see chart for details).    Discussed with patient's son and power of attorney, Jonya Inglett.  Agreed with comfort care measures.  Consulted palliative care who saw the patient in the emergency department. Palliative care nurse coordinating care measures.  Family at bedside with patient.  Time of death 10-Mar-1208.  Final Clinical Impressions(s) / ED Diagnoses     Final diagnoses:  End stage congestive heart failure (HCC)  Urinary tract infection without hematuria, site unspecified    ED Discharge Orders    None       Loren Racer, MD December 01, 2018 1214

## 2018-12-16 NOTE — Progress Notes (Addendum)
Palliative Medicine RN Note: Consult order noted. Dr Ranae Palms called our office to ask for help with goals & placement decision.  I saw patient in [facility]. She is not responsive to voice, but will respond to touch. She is trying to pull of O2 and ECG leads. Monitor shows BP 84/50, sat 79% on NRB. Gold DNR form on chart from facility.   I called and spoke with her son/POA Jennifer Zavala [phone]). We discussed her GOC and her desire to NOT have life-prolonging measures if death is imminent. He reports that they have been told before (after her CVA) that she was "not going to make it." I explained her VS after her fluids and the concern I have that our therapies are prolonging her death instead of improving her life or adding quality. Jennifer Zavala verbalized understanding and reports that goal, above all else is comfort.   We made a plan, which I discussed with Dr Phillips Odor (PMT medical director) and Dr Ranae Palms (EDP), to stop any life-prolonging treatments and switch completely to comfort care. Jennifer Zavala has a local son who will come visit in about 30 minutes, and we will leave the NRB on until that time.  When Jennifer Zavala arrived at 21-Mar-1033, he called his brother to discuss removal of the NRB and to ensure they were both in agreement. We discussed medication plan and plan for focus on comfort and dignity. NRB was removed at 1049 to RA.   Jennifer Zavala and his wife did not stay. Jennifer Zavala continued to have dyspnea, and I stayed in contact with Dr Phillips Odor, who adjusted her medications several times. RN Jennifer Zavala gave ordered dilaudid and ativan (morphine is contra-indicated in patients with impaired renal function).  Jennifer Zavala did indicate that they want to use Jennifer Zavala on [facility] for services.   1203 Resp rate around 7. HR has dropped from 120s to 35.  1206 Apneic with asystole on monitor.   Mar 21, 1205 Death pronounced by Dr Ranae Palms. I called both Jennifer Zavala and Jennifer Zavala to notify them. I also returned a call to Jennifer Zavala [facility] NP to update her  on Jennifer Zavala's death.  Jennifer Chance Makynzee Tigges, RN, BSN, [facility] Palliative Medicine Team 12-Dec-2018 12:36 PM Office [phone]

## 2018-12-16 NOTE — ED Notes (Signed)
Pulse ox only picking up at intervals, spot checking  94% with NRB

## 2018-12-16 NOTE — ED Triage Notes (Signed)
Patients to ed via [facility] states patient was unresp this am. Unsure last seen norm. Her normal is mumbling. After 600cc fluids ems states patient started moving around a little more. Remains non verbal

## 2018-12-16 DEATH — deceased

## 2019-02-02 IMAGING — DX DG CHEST 1V PORT
1 series · 1 of 1 positions shown · non-contrast
Comparison: Chest x-ray 10/16/2017.

CLINICAL DATA: Shortness of breath.

EXAM:
PORTABLE CHEST 1 VIEW

[chest]
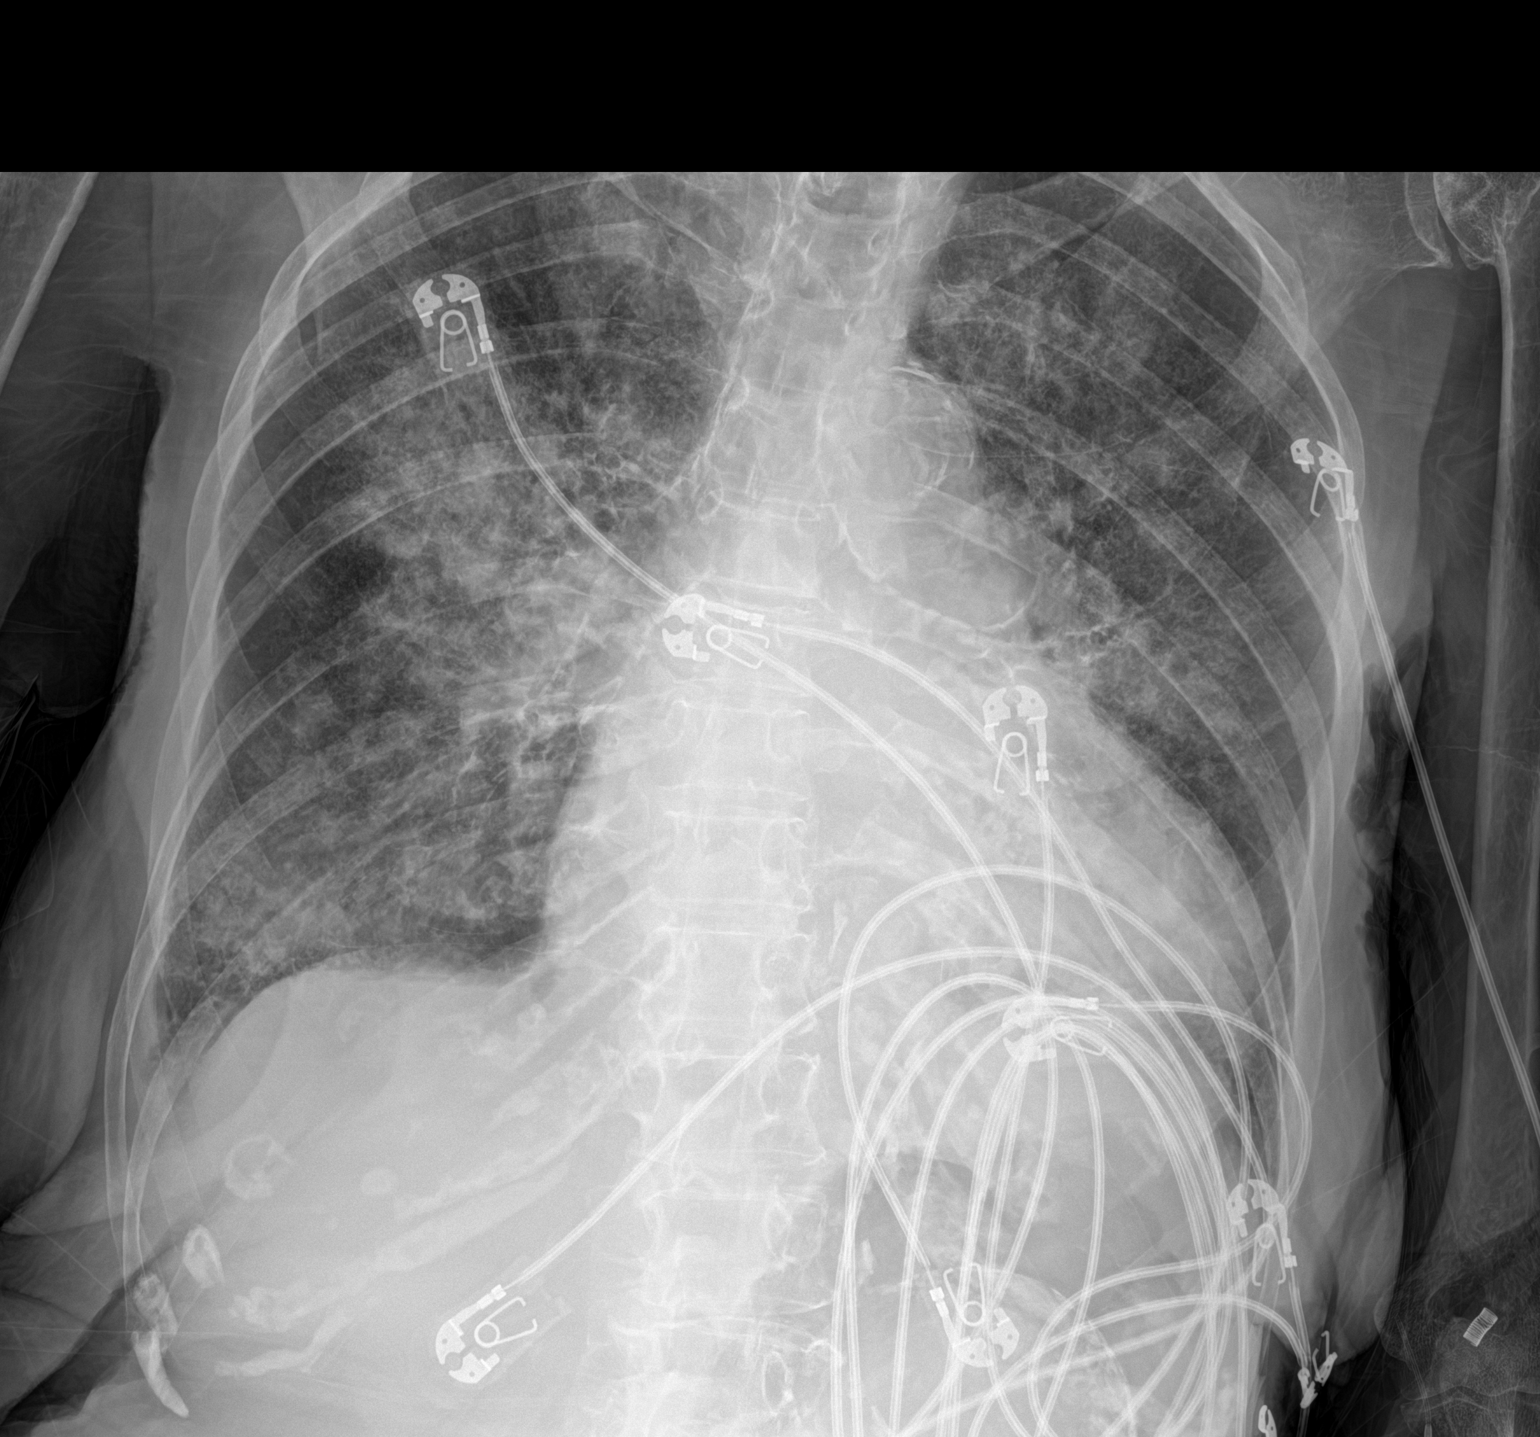

[1 of 1 positions shown; findings below may reference images not displayed]

FINDINGS: Cardiomegaly with diffuse bilateral pulmonary infiltrates consistent
with pulmonary edema. Bilateral pneumonia can not be excluded. Small
right pleural effusion. No pneumothorax. Diffuse osteopenia.
Degenerative change thoracic spine. Prominent rounded calcific
density noted projected over the left upper abdomen, abdominal
series can be obtained to further evaluate.
IMPRESSION: 1. Cardiomegaly with diffuse bilateral pulmonary infiltrates
consistent with pulmonary edema. Bilateral pneumonia can not be
excluded.

2. Prominent rounded calcific density noted projected over the left
upper abdomen. Abdominal series can be obtained to further evaluate.
